# Patient Record
Sex: Female | Born: 1995 | Race: Asian
Health system: Southern US, Academic
[De-identification: ages and names within clinical notes are randomized; demographics above are authoritative.]

## PROBLEM LIST (undated history)

## (undated) ENCOUNTER — Encounter

## (undated) DIAGNOSIS — F319 Bipolar disorder, unspecified: Secondary | ICD-10-CM

## (undated) DIAGNOSIS — Z9114 Patient's other noncompliance with medication regimen: Secondary | ICD-10-CM

## (undated) DIAGNOSIS — F603 Borderline personality disorder: Secondary | ICD-10-CM

## (undated) DIAGNOSIS — F4310 Post-traumatic stress disorder, unspecified: Secondary | ICD-10-CM

## (undated) DIAGNOSIS — Z91148 Patient's other noncompliance with medication regimen for other reason: Secondary | ICD-10-CM

## (undated) DIAGNOSIS — F913 Oppositional defiant disorder: Secondary | ICD-10-CM

## (undated) DIAGNOSIS — F909 Attention-deficit hyperactivity disorder, unspecified type: Secondary | ICD-10-CM

## (undated) DIAGNOSIS — F32A Depression, unspecified: Secondary | ICD-10-CM

## (undated) DIAGNOSIS — R569 Unspecified convulsions: Secondary | ICD-10-CM

## (undated) DIAGNOSIS — F419 Anxiety disorder, unspecified: Secondary | ICD-10-CM

## (undated) DIAGNOSIS — F329 Major depressive disorder, single episode, unspecified: Secondary | ICD-10-CM

---

## 2017-02-04 DIAGNOSIS — F329 Major depressive disorder, single episode, unspecified: Secondary | ICD-10-CM

## 2017-02-04 DIAGNOSIS — F319 Bipolar disorder, unspecified: Secondary | ICD-10-CM

## 2017-02-04 DIAGNOSIS — R45851 Suicidal ideations: Secondary | ICD-10-CM

## 2017-02-04 DIAGNOSIS — Z9119 Patient's noncompliance with other medical treatment and regimen: Principal | ICD-10-CM

## 2017-02-04 DIAGNOSIS — G40909 Epilepsy, unspecified, not intractable, without status epilepticus: Secondary | ICD-10-CM

## 2017-02-04 DIAGNOSIS — B373 Candidiasis of vulva and vagina: Secondary | ICD-10-CM

## 2017-02-04 MED ORDER — LITHIUM CARBONATE 150 MG PO CAPS
Freq: Three times a day (TID) | ORAL | Status: SS
Start: 2017-02-04 — End: 2017-02-05

## 2017-02-04 MED ORDER — SERTRALINE HCL 25 MG PO TABS
Freq: Every day | ORAL | Status: SS
Start: 2017-02-04 — End: 2017-02-05

## 2017-02-04 MED ORDER — OLANZAPINE 10 MG PO TABS
Freq: Every evening | ORAL | Status: SS
Start: 2017-02-04 — End: 2017-02-09

## 2017-02-04 MED ORDER — ACETAMINOPHEN 325 MG PO TABS
650 mg | ORAL | Status: DC | PRN
Start: 2017-02-04 — End: 2017-02-05

## 2017-02-04 MED ORDER — DIVALPROEX SODIUM 125 MG PO TBEC
ORAL | Status: SS
Start: 2017-02-04 — End: 2017-02-09

## 2017-02-05 ENCOUNTER — Inpatient Hospital Stay: Admit: 2017-02-05 | Discharge: 2017-02-09 | Disposition: A | Discharge status: Home / Self Care | Admitting: Psychiatry

## 2017-02-05 DIAGNOSIS — F329 Major depressive disorder, single episode, unspecified: Secondary | ICD-10-CM

## 2017-02-05 DIAGNOSIS — Z9119 Patient's noncompliance with other medical treatment and regimen: Secondary | ICD-10-CM

## 2017-02-05 DIAGNOSIS — F319 Bipolar disorder, unspecified: Secondary | ICD-10-CM

## 2017-02-05 MED ORDER — LITHIUM CARBONATE 150 MG PO CAPS
300 mg | Freq: Two times a day (BID) | ORAL | Status: DC
Start: 2017-02-05 — End: 2017-02-10

## 2017-02-05 MED ORDER — ONDANSETRON 4 MG PO TBDP
4 mg | Freq: Four times a day (QID) | ORAL | Status: CP | PRN
Start: 2017-02-05 — End: ?

## 2017-02-05 MED ORDER — DIPHENHYDRAMINE HCL 50 MG PO CAPS
50 mg | Freq: Every evening | ORAL | Status: DC | PRN
Start: 2017-02-05 — End: 2017-02-10

## 2017-02-05 MED ORDER — OLANZAPINE 10 MG PO TABS
10 mg | Freq: Every evening | ORAL | Status: DC
Start: 2017-02-05 — End: 2017-02-10

## 2017-02-05 MED ORDER — SERTRALINE HCL 25 MG PO TABS
25 mg | Freq: Every day | ORAL | Status: DC
Start: 2017-02-05 — End: 2017-02-05

## 2017-02-05 MED ORDER — ALUM & MAG HYDROX-SIMETH 1200-1200-120 MG/30ML PO SUSP UD
30 mL | ORAL | Status: CP | PRN
Start: 2017-02-05 — End: ?

## 2017-02-05 MED ORDER — LORAZEPAM 2 MG/ML IJ SOLN
1 mg | INTRAMUSCULAR | Status: DC | PRN
Start: 2017-02-05 — End: 2017-02-10

## 2017-02-05 MED ORDER — BENZTROPINE MESYLATE 1 MG/ML IJ SOLN
1 mg | Freq: Four times a day (QID) | INTRAMUSCULAR | Status: DC | PRN
Start: 2017-02-05 — End: 2017-02-10

## 2017-02-05 MED ORDER — LITHIUM CARBONATE 150 MG PO CAPS
150 mg | Freq: Three times a day (TID) | ORAL | Status: DC
Start: 2017-02-05 — End: 2017-02-05

## 2017-02-05 MED ORDER — BENZTROPINE MESYLATE 1 MG PO TABS
1 mg | Freq: Four times a day (QID) | ORAL | Status: DC | PRN
Start: 2017-02-05 — End: 2017-02-10

## 2017-02-05 MED ORDER — DIVALPROEX SODIUM 125 MG PO CSDR
125 mg | Freq: Every day | ORAL | Status: DC
Start: 2017-02-05 — End: 2017-02-05

## 2017-02-05 MED ORDER — LITHIUM CARBONATE 300 MG PO CAPS
300 mg | Freq: Two times a day (BID) | ORAL | 0 refills | Status: SS
Start: 2017-02-05 — End: 2017-02-09

## 2017-02-05 MED ORDER — ACETAMINOPHEN 325 MG PO TABS
650 mg | Freq: Four times a day (QID) | ORAL | Status: CP | PRN
Start: 2017-02-05 — End: ?

## 2017-02-05 MED ORDER — HALOPERIDOL 2 MG/1 ML UNIT DOSE ORAL LIQD JX
2 mg | ORAL | Status: DC | PRN
Start: 2017-02-05 — End: 2017-02-10

## 2017-02-05 MED ORDER — ONDANSETRON 4 MG PO TBDP
4 mg | Freq: Four times a day (QID) | ORAL | Status: DC | PRN
Start: 2017-02-05 — End: 2017-02-05

## 2017-02-05 MED ORDER — GUAIFENESIN 300MG/15ML UD
15 mL | Freq: Four times a day (QID) | ORAL | Status: CP | PRN
Start: 2017-02-05 — End: ?

## 2017-02-05 MED ORDER — SERTRALINE HCL 100 MG PO TABS
100 mg | Freq: Every day | ORAL | 0 refills | Status: SS
Start: 2017-02-05 — End: 2017-02-09

## 2017-02-05 MED ORDER — HALOPERIDOL LACTATE 5 MG/ML IJ SOLN
2 mg | INTRAMUSCULAR | Status: DC | PRN
Start: 2017-02-05 — End: 2017-02-10

## 2017-02-05 MED ORDER — OLANZAPINE 10 MG PO TABS
10 mg | Freq: Every evening | ORAL | Status: DC
Start: 2017-02-05 — End: 2017-02-05

## 2017-02-05 MED ORDER — HALOPERIDOL 2 MG PO TABS
2 mg | ORAL | Status: DC | PRN
Start: 2017-02-05 — End: 2017-02-10

## 2017-02-05 MED ORDER — SERTRALINE HCL 50 MG PO TABS
50 mg | Freq: Every day | ORAL | Status: DC
Start: 2017-02-05 — End: 2017-02-10

## 2017-02-05 MED ORDER — LORAZEPAM 1 MG PO TABS
1 mg | ORAL | Status: DC | PRN
Start: 2017-02-05 — End: 2017-02-10

## 2017-02-05 MED ORDER — LORAZEPAM 1 MG/0.5 ML UNIT DOSE ORAL LIQD JX
1 mg | ORAL | Status: DC | PRN
Start: 2017-02-05 — End: 2017-02-10

## 2017-02-05 NOTE — Consults
Hemoglobin 10.5 (L) 12.0 - 16.0 g/dL    Hematocrit 96.034.6 (L) 37.0 - 47.0 %    MCV 80.3 (L) 82.0 - 101.0 fl    MCH 24.4 (L) 27.0 - 34.0 pg    MCHC 30.3 (L) 31.0 - 36.0 g/dL    RDW 45.416.8 (H) 09.812.0 - 16.1 %    Platelet Count 199 140 - 440 thou/cu mm    MPV 11.4 9.5 - 11.5 fl    nRBC % 0.0 0.0 - 1.0 %    Absolute NRBC Count 0.00    MANUAL DIFF JAX    Collection Time: 02/05/17  3:37 AM   Result Value Ref Range    Neutrophil % 39 %    Bands % 0 %    Lymphs % 54 %    Monocytes % 5 2 - 6 %    Eos % 2 1 - 4 %    Basos % 0 0 - 1 %    Metamyelocytes % 0 %    Myelocytes % 0 %    Promyelocytes % 0 %    Atypical Lymphocytes % 0 %    Neutrophil Abs 1.70 (L) 1.8 - 8.7 x10E3/uL    Lymphs Abs 2.40 thou/ cu mm    Monos Abs 0.23 x10E3/uL    Eos Abs 0.07 thou/ cu mm    Basos Abs 0.00     Immature Granulocytes Absolute 0.00 <=0.00 x10E3/uL   MORPHOLOGY JAX    Collection Time: 02/05/17  3:37 AM   Result Value Ref Range    Platelet Estimate Normal     Hypochromia Slight     Burr Cells Occasional     Poikilocytes Moderate     Diff Comment #DIGIM    Urinalysis W/Microscopy    Collection Time: 02/05/17  3:41 AM   Result Value Ref Range    Color -Ur Yellow Amber    Clarity, UA Clear Hazy    Specific Gravity, Urine 1.016 1.003 - 1.030    pH, Urine 6.0 4.5 - 8.0    Protein-UA Negative Negative mg/dL    Glucose -Ur Negative Negative mg/dL    Ketones UA Negative Negative mg/dL    Bilirubin -Ur Negative Negative    Blood -Ur Negative Negative    Nitrite -Ur Negative Negative    Urobilinogen -Ur 2.0 (A) Normal mg/dL    Leukocytes -Ur Negative Negative    RBC -Ur 1 0 - 5 /HPF    WBC -Ur 2 0 - 5 /HPF    Squam Epithel, UA <1 Not established /HPF    Bacteria -Ur None seen None seen /HPF    Mucus -Ur Rare 2+ /LPF    ASCORBIC ACID Negative 20 mg/dL   Urine Drugs of Abuse (DOA)    Collection Time: 02/05/17  3:41 AM   Result Value Ref Range    Amphetamine Screen, Urine Not Detected Not Detected    Barbiturate Screen, Urine Not Detected Not Detected

## 2017-02-05 NOTE — Consults
Benzodiazepine Screen, Urine Not Detected Not Detected    Cocaine Metabolites, Ur Not Detected Not Detected    Methadone Screen, Urine Not Detected Not Detected    Opiate Screen, Urine Not Detected Not Detected    OXYCODONE SCREEN URINE Not Detected Not Detected    Cannabinoid Scrn, Ur Not Detected Not Detected   HCG, Qualitative, Urine    Collection Time: 02/05/17  3:41 AM   Result Value Ref Range    HCG-Urine Negative Negative   POCT Urinalysis w/o Microscopy auto    Collection Time: 02/05/17  3:48 AM   Result Value Ref Range    Color -Ur Yellow     Clarity, UA Clear     Spec Grav 1.020 1.003 - 1.030    pH 7.0 (L) 7.4 - 7.4    Urobilinogen -Ur 1.0 <=2.0 E.U./dL    Nitrite -Ur Negative Negative    Protein-UA Negative Negative mg/dL    Glucose -Ur Negative Negative mg/dL    Blood, UA Negative Negative    WBC, UA Negative Negative    Bilirubin -Ur Negative Negative    Ketones -UR Negative Negative   POCT Urine Pregnancy    Collection Time: 02/05/17  3:49 AM   Result Value Ref Range    Preg Test, Urine (POC) Negative Negative             Assessment and Plan:    Leafy Halfmaris Erline HauRuth Happe is a 21 y.o. female with PMH of seizure disorder admitted for psychiatric illness    1. Seizure disorder- Continue Depakote 125mg  po daily  2. Rest per primary team

## 2017-02-05 NOTE — Consults
BP Temp Temp src Pulse Resp SpO2 Height Weight   02/05/17 0555 116/69 37 ?C (98.6 ?F) Oral 65 18 - 1.702 m (_0 ) 78.5 kg (173 lb)   02/05/17 0505 124/74 36.7 ?C (98.1 ?F) Oral 71 16 100 % - -   02/05/17 0209 105/61 36.9 ?C (98.4 ?F) Oral 90 18 99 % - -   02/04/17 2134 123/72 36.8 ?C (98.2 ?F) Oral 64 20 100 % 1.702 m (_1 ) 78.5 kg (173 lb)        Patient Vitals for the past 72 hrs:   Weight   02/04/17 2134 78.5 kg (173 lb)   02/05/17 0555 78.5 kg (173 lb)       General; Not in acute distress    Eyes; No pallor, No Icterus    Skin; No Rash, No ulcers    Neck; No thyromegaly, No Lymphadenopathy    Lungs; No dullness, No wheeze/crackles    Cardiac; No cardiomegaly, no murmurs    Abdomen; Non tender, Normal bowel sounds    Extremities; No edema, No leg ulcers    CNS; Normal Cranial nerves, No weakness      LABS and other tests;   Recent Results (from the past 48 hour(s))   Comprehensive Metabolic Panel    Collection Time: 02/05/17  3:37 AM   Result Value Ref Range    Sodium 141 135 - 145 mmol/L    Potassium 3.8 3.3 - 4.6 mmol/L    Chloride 101 101 - 110 mmol/L    CO2 23 21 - 29 mmol/L    Urea Nitrogen 9 6 - 22 mg/dL    Creatinine 0.67 0.51 - 0.95 mg/dL    BUN/Creatinine Ratio 13.4 6.0 - 22.0 (calc)    Glucose 84 71 - 99 mg/dL    Calcium 8.6 8.6 - 10.0 mg/dL    Total Protein 5.8 (L) 6.5 - 8.3 g/dL    Albumin 3.5 (L) 3.8 - 4.9 g/dL    Calc Total Globuin 2.3 gm/dL    ALBUMIN/GLOBULIN RATIO 1.5 (calc)    Total Bilirubin 0.2 0.2 - 1.0 mg/dL    Alkaline Phosphatase 56 35 - 104 IU/L    AST 13 (L) 14 - 33 IU/L    ALT 6 (L) 10 - 42 IU/L    Osmolality Calc 279.1     Anion Gap 17 (H) 4 - 16 mmol/L    EGFR >59 mL/min/1.73M2   TSH    Collection Time: 02/05/17  3:37 AM   Result Value Ref Range    TSH 1.890 0.270 - 4.200 mIU/L   CBC with Differential panel result    Collection Time: 02/05/17  3:37 AM   Result Value Ref Range    WBC 4.41 (L) 4.5 - 11 x10E3/uL    RBC 4.31 4.20 - 5.40 x10E6/uL

## 2017-02-05 NOTE — ED Provider Notes
Yes, filed at 02/04/17 2206  by Wynonia HazardMichel, Keegan Anne, MD

## 2017-02-05 NOTE — Consults
Department of Medicine  IM Consult H&P for Psychiatry     Admission Date and Time: 02/04/2017  9:57 PM   Primary Care Physician: No primary care provider on file.   CC and HPI:   Cheyenne Rhodes is a 21 y.o. female who is being admitted for the evaluation and management of psychiatric illness.  Medicine consultation has been requested for the management of any medical problems.    Patient has no complaints of SOB, dysuria, fever or productive cough.  She stated she took Depakote in the past for seizure disorder. Her last seizure was last week              Past Medical History:   Diagnosis Date   ? Bipolar 1 disorder    ? Depression    ? Personal history of noncompliance with medical treatment, presenting hazards to health      History reviewed. No pertinent surgical history.  No family history on file.  Social History     Social History   ? Marital status: Single     Spouse name: N/A   ? Number of children: N/A   ? Years of education: N/A     Social History Main Topics   ? Smoking status: Never Smoker   ? Smokeless tobacco: None   ? Alcohol use No   ? Drug use: No   ? Sexual activity: Not Asked     Other Topics Concern   ? None     Social History Narrative       Home Medications:  Prescriptions Prior to Admission   Medication Sig   ? divalproex (DEPAKOTE) 125 MG PO Tablet Delayed Release by mouth.   ? lithium 150 MG PO Capsule Take by mouth 3 times daily (with meals).   ? OLANZapine (ZYPREXA) 10 MG PO Tablet Take by mouth nightly at bedtime.   ? sertraline (ZOLOFT) 25 MG PO Tablet Take by mouth daily.     Allergies   Allergen Reactions   ? Chocolate Angioedema   ? Risperidone Other (See Comments)     "I dont really know, I just know I'm allergic"        Review of Systems:  Cardiovascular - No Chest pain, no palpitations  GI; No Melena  Gen; No unintentional weight loss  All other review of systems are negative.    Physical Exam:     Patient Vitals for the past 24 hrs:

## 2017-02-05 NOTE — ED Provider Notes
Procedures    Labs:  - - No data to display      Imaging (Read by ED Provider):  {Imaging findings:[phone]}      EKG (Read by ED Provider):  {EKG findings:[id number]}        ED Course & Re-Evaluation     ED Course     Pt evlauted for SI. Gerrit HeckKeegan Michel, MD 10:07 PM 02/04/2017        MDM   Decide to obtain history from someone other than the patient: {SH ED Lamonte SakaiJX MDM - OBTAIN [id number]:91478}HISTORY:28378}    Decide to obtain previous medical records: [facility]{SH ED Lamonte SakaiJX MDM - PREVIOUS MED REC - NO GNF:62130}YES:28380}    Clinical Lab Test(s): {SH ED Lamonte SakaiJX MDM ORDERED AND REVIEWED:28124}    Diagnostic Tests (Radiology, EKG): {SH ED Lamonte SakaiJX MDM ORDERED AND REVIEWED:28124}    Independent Visualization (ED US, Wet Prep, Other): {SH ED Lamonte SakaiJX MDM NO YES QMVHQION:62952}WILDCARD:26444}    Discussed patient with NON-ED Provider: {SH ED Lamonte SakaiJX MDM - ANOTHER PROVIDER:28381}    ED Disposition   ED Disposition: No ED Disposition Set    ED Clinical Impression   ED Clinical Impression:   No Clinical Impression Set    ED Patient Status   Patient Status:   {[facility] PAT[facility]US:[phone]}    ED Medical Evaluation Initiated   Medical Evaluation Initiated:   Yes, filed at 02/04/17 2206  by Wynonia HazardMichel, Keegan Anne, MD

## 2017-02-05 NOTE — ED Provider Notes
Neck: Normal range of motion. Neck supple.   Cardiovascular: Normal rate, regular rhythm, normal heart sounds and intact distal pulses.  Exam reveals no gallop.    No murmur heard.  Pulmonary/Chest: Effort normal and breath sounds normal. No respiratory distress. She has no wheezes. She has no rales. She exhibits no tenderness.   Abdominal: Soft. Bowel sounds are normal. She exhibits no distension and no mass. There is no tenderness. There is no rebound and no guarding.   Musculoskeletal: Normal range of motion. She exhibits no edema, tenderness or deformity.   Neurological: She is alert and oriented to person, place, and time. She has normal reflexes. She exhibits normal muscle tone. Coordination normal.   Skin: Skin is warm and dry. No rash noted. She is not diaphoretic.   Psychiatric:   Blunted affect, alert and oriented. Goal directed.    Nursing note and vitals reviewed.      Differential DDx: SI, HI, hypothyroidism, other.     Is this an Emergent Medical Condition? Yes - Severe Pain/Acute Onset of Symptons  409.901 FS  641.19 FS  627.732 (16) FS    ED Workup   Procedures    Labs:  - - No data to display    Imaging (Read by ED Provider):  not applicable      EKG (Read by ED Provider):  not applicable    ED Course & Re-Evaluation     ED Course     Pt evlauted for SI. Gerrit HeckKeegan Michel, MD 10:07 PM 02/04/2017        MDM   Decide to obtain history from someone other than the patient: No    Decide to obtain previous medical records: No    Clinical Lab Test(s): N/A    Diagnostic Tests (Radiology, EKG): N/A    Independent Visualization (ED US, Wet Prep, Other): No    Discussed patient with NON-ED Provider: None    ED Disposition   ED Disposition: No ED Disposition Set    ED Clinical Impression   ED Clinical Impression:   Suicidal ideation  Bipolar 1 disorder  Depression, acute    ED Patient Status   Patient Status:   Good    ED Medical Evaluation Initiated   Medical Evaluation Initiated:

## 2017-02-05 NOTE — Plan of Care
Problem: Risk for Suicide  Goal: Will not make suicide attempt while in hospital  Short Term        Intervention: Assess for suicide risk q shift and PRN  Pt slept til lunch, but has been up since, interacting with peers and staff.  Was c/o "cyst" on right side of lower head and neck.  Consult put in for IM in the AM.  Denies s/h, a/v, o to self, somewhat vague on others.  Eating well (excrpt breakfast), cooperative with unit routine and no behavior problem.  Will continue to support and monitor.

## 2017-02-05 NOTE — ED Triage Notes
Arrived from cousin house via Presenter, broadcastingjso officer under a Teacher, early years/prebaker act. A/ox3 c/o feeling depressed and wanting to leave this earth aka commit suicide. To ecc.

## 2017-02-05 NOTE — ED Notes
[facility] at the bedside to take pt to [facility] via stretcher w/ chart & personal belongings.

## 2017-02-05 NOTE — Psych Nurses Notes
Admission: 02/05/17 0555    BA: "Patient off her medcations and states that she wants to harm self"     Patient arrives to the unit in green scrubs, alert and oriented x4. Patient maintains a blunted affect, appropriate eye contact and responds to prompts appropriately. Patient reports that, "I have been living with my family and they do stuff I dont want to get into." Patient states that she does think of "suicide, but I don't have a plan. I was just [employer] for [employer]." Patient reports that she has, "been depressed for about 6 months and I havent had my medications for about 1 month because I dont have a primary care doctor and I ran out of my meds." Patient states that she was previously on Lithium, Depakote, Zoloft and "a bunch of other meds, like 2 pages worth." Patient states that she has a history of seizures, which was not identified in her chart, and, "I go into these spells where I just stare into space and then I shake and stuff." Patient reports no other medical conditions. Patient denies any hallucinations or delusions. Patient states, "I have pills in my bag that I have but im not sure what all they are." Patient states that she does not use illicit drugs or alcohol. Patient reports mild anxiety and states, "I get anxious sometimes and scratch my skin." Patient has scratch marks to left arm that are superficial.     Gown out and body observation completed by Richelle and Sylivia. No abnormal findings on skin and no contraband found on patient.

## 2017-02-05 NOTE — Progress Notes
Pt seen and chart reviewed, 20y/o AA/F, Arrived from cousin's house via Presenter, broadcastingjso officer under a Teacher, early years/prebaker act. Pt reports she had a fight with family and as such is feeling s/i. [facility] She is noncompliant with rx and "doesn't remember" what she was prescribed while there. Denies any f/u. No etoh or illicits. Unemployed. 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 No homeless.     Currently, a,a and Ox4. Speech is clear and logical. thoughts organized  and relevant. Eye contact is good. Grooming is good. No indication of a/v/h, delusions or sx of mania. No h/i. S/i as above. Calm and cooperative.     Recs - ba to be maintained and pt has been referred to [facility].     Axis I r/o depressive d/o nos vs malingering due to homelessness.   Axis II Deferred  Axis III stable     Language and Speech  Spoken Language: English    Vitals  Weight: 78.5 kg (173 lb)  Temp: 36.8 ?C (98.2 ?F)  Temp src: Oral  Pulse: 64  Resp: 20  BP: 123/72

## 2017-02-05 NOTE — ED Provider Notes
History     Chief Complaint   Patient presents with   ? Suicidal     Patient is a 21 y.o. female presenting with mental health issue. The history is provided by medical records and the patient. No language interpreter was used.   Mental Health Problem       Allergies   Allergen Reactions   ? Chocolate Angioedema       Patient's Medications   New Prescriptions    No medications on file   Previous Medications    DIVALPROEX (DEPAKOTE) 125 MG PO TABLET DELAYED RELEASE    by mouth.    LITHIUM 150 MG PO CAPSULE    Take by mouth 3 times daily (with meals).    OLANZAPINE (ZYPREXA) 10 MG PO TABLET    Take by mouth nightly at bedtime.    SERTRALINE (ZOLOFT) 25 MG PO TABLET    Take by mouth daily.   Modified Medications    No medications on file   Discontinued Medications    No medications on file       Past Medical History:   Diagnosis Date   ? Bipolar 1 disorder    ? Depression    ? Personal history of noncompliance with medical treatment, presenting hazards to health      History reviewed. No pertinent surgical history.    No family history on file.    Social History     Social History   ? Marital status: N/A     Spouse name: N/A   ? Number of children: N/A   ? Years of education: N/A     Social History Main Topics   ? Smoking status: Never Smoker   ? Smokeless tobacco: None   ? Alcohol use No   ? Drug use: No   ? Sexual activity: Not Asked     Other Topics Concern   ? None     Social History Narrative   ? None       Review of Systems    Physical Exam       ED Triage Vitals   BP 02/04/17 2134 123/72   Pulse 02/04/17 2134 64   Resp 02/04/17 2134 20   Temp 02/04/17 2134 36.8 ?C (98.2 ?F)   Temp src 02/04/17 2134 Oral   Height 02/04/17 2134 1.702 m   Weight 02/04/17 2134 78.5 kg   SpO2 02/04/17 2134 100 %   BMI (Calculated) 02/04/17 2134 27.15     Physical Exam    Differential DDx: ***    Is this an Emergent Medical Condition? Yes - Severe Pain/Acute Onset of Symptons  409.901 FS  641.19 FS  627.732 (16) FS    ED Workup

## 2017-02-05 NOTE — ED Provider Notes
Results may be affected by exogenous biotin consumption. Please consult the laboratory if an interference is suspected.   POCT URINE PREGNANCY - Normal    Preg Test, Urine (POC) Negative  Negative   MORPHOLOGY JAX    Platelet Estimate Normal      Hypochromia Slight      Burr Cells Occasional      Poikilocytes Moderate      Diff Comment #DIGIM     URINALYSIS W/MICROSCOPY   HCG, QUALITATIVE, URINE   POCT URINE PREGNANCY   POCT URINALYSIS AUTO W/O MICROSCOPY   CBC AND DIFFERENTIAL     Imaging (Read by ED Provider):  not applicable      EKG (Read by ED Provider):  not applicable    ED Course & Re-Evaluation     ED Course     Pt evlauted for SI. Gerrit HeckKeegan Michel, MD 10:07 PM 02/04/2017    Pregnancy is negative, no other complaints, pt is medically cleared. Will place psych continuation orders and home medications. Gerrit HeckKeegan Michel, MD 5:05 AM 02/05/2017    Reccomend maintainins BA and pt is awaiting transfer to psych facility.     MDM   Decide to obtain history from someone other than the patient: No    Decide to obtain previous medical records: No    Clinical Lab Test(s): N/A    Diagnostic Tests (Radiology, EKG): N/A    Independent Visualization (ED US, Wet Prep, Other): No    Discussed patient with NON-ED Provider: None    ED Disposition   ED Disposition: Transfer to Another Facility    ED Clinical Impression   ED Clinical Impression:   Suicidal ideation  Bipolar 1 disorder  Depression, acute    ED Patient Status   Patient Status:   Good    ED Medical Evaluation Initiated   Medical Evaluation Initiated:   Yes, filed at 02/04/17 2206  by Wynonia HazardMichel, Keegan Anne, MD             Wynonia HazardMichel, Keegan Anne, MD  Resident  02/05/17 [phone]

## 2017-02-05 NOTE — ED Provider Notes
No family history on file.    Social History     Social History   ? Marital status: N/A     Spouse name: N/A   ? Number of children: N/A   ? Years of education: N/A     Social History Main Topics   ? Smoking status: Never Smoker   ? Smokeless tobacco: None   ? Alcohol use No   ? Drug use: No   ? Sexual activity: Not Asked     Other Topics Concern   ? None     Social History Narrative   ? None       Review of Systems   Constitutional: Negative for fever and chills.   HENT: Negative for trouble swallowing and neck stiffness.    Eyes: Negative for visual disturbance.   Respiratory: Negative for chest tightness, shortness of breath and wheezing.    Cardiovascular: Negative for chest pain.   Gastrointestinal: Negative for nausea, vomiting, abdominal pain, diarrhea and abdominal distention.   Genitourinary: Negative for dysuria.   Musculoskeletal: Negative for gait problem.   Skin: Negative for wound.   Neurological: Negative for weakness.   Hematological: Negative for lymphadenopathy.     Psychiatric/Behavioral: Positive for suicidal ideas. Negative for behavioral problems.   Allergic/Immunologic: Negative for immunocompromised state.   All other systems reviewed and are negative.      Physical Exam       ED Triage Vitals   BP 02/04/17 2134 123/72   Pulse 02/04/17 2134 64   Resp 02/04/17 2134 20   Temp 02/04/17 2134 36.8 ?C (98.2 ?F)   Temp src 02/04/17 2134 Oral   Height 02/04/17 2134 1.702 m   Weight 02/04/17 2134 78.5 kg   SpO2 02/04/17 2134 100 %   BMI (Calculated) 02/04/17 2134 27.15     Physical Exam   Constitutional: She is oriented to person, place, and time. She appears well-developed and well-nourished. No distress.   HENT:   Head: Normocephalic and atraumatic.   Right Ear: External ear normal.   Left Ear: External ear normal.   Mouth/Throat: No oropharyngeal exudate.   Eyes: Conjunctivae and EOM are normal. Pupils are equal, round, and reactive to light. No scleral icterus.

## 2017-02-05 NOTE — Psych Nurses Notes
Pt signed voluntary.

## 2017-02-05 NOTE — ED Notes
Pt assigned to [facility] room 404. Report to Xcel Energyichelle, Charity fundraiserN.

## 2017-02-05 NOTE — ED Provider Notes
Results may be affected by exogenous biotin consumption. Please consult the laboratory if an interference is suspected.   POCT URINE PREGNANCY - Normal    Preg Test, Urine (POC) Negative  Negative   MORPHOLOGY JAX    Platelet Estimate Normal      Hypochromia Slight      Burr Cells Occasional      Poikilocytes Moderate      Diff Comment #DIGIM     URINALYSIS W/MICROSCOPY   HCG, QUALITATIVE, URINE   POCT URINE PREGNANCY   POCT URINALYSIS AUTO W/O MICROSCOPY   CBC AND DIFFERENTIAL     Imaging (Read by ED Provider):  not applicable      EKG (Read by ED Provider):  not applicable    ED Course & Re-Evaluation     ED Course     Pt evlauted for SI. Gerrit HeckKeegan Michel, MD 10:07 PM 02/04/2017    Pregnancy is negative, no other complaints, pt is medically cleared. Will place psych continuation orders and home medications. Gerrit HeckKeegan Michel, MD 5:05 AM 02/05/2017    Reccomend maintainins BA and pt is awaiting transfer to psych facility.     MDM   Decide to obtain history from someone other than the patient: No    Decide to obtain previous medical records: No    Clinical Lab Test(s): N/A    Diagnostic Tests (Radiology, EKG): N/A    Independent Visualization (ED US, Wet Prep, Other): No    Discussed patient with NON-ED Provider: None    ED Disposition   ED Disposition: Transfer to Another Facility    ED Clinical Impression   ED Clinical Impression:   Suicidal ideation  Bipolar 1 disorder  Depression, acute    ED Patient Status   Patient Status:   Good    ED Medical Evaluation Initiated   Medical Evaluation Initiated:   Yes, filed at 02/04/17 2206  by Wynonia HazardMichel, Keegan Anne, MD

## 2017-02-05 NOTE — ED Provider Notes
History     Chief Complaint   Patient presents with   ? Suicidal     HPI Comments: Pt is a 21 yo female with PMHx of depression, bipolar 1, was baker acted in january of 2018 and at that time diagnosed and started on lithium and zyprexia. She staets she took those for about a month, then stopped taking her medications one month prior. She then was kicked out of her house by her mom and has been living with friends. She was with her cousin and had been feeling very depressed for the last 2 wqeeks but that she has been gradually worsening. Today she told her cousin that she just didnt want to live any more and her cousin caleld police who placed her under a baker act. She denies hallucination, she denies ingestion of drug use.     Patient is a 21 y.o. female presenting with mental health issue. The history is provided by medical records and the patient. No language interpreter was used.   Mental Health Problem   This is a new problem. The current episode started today. The problem occurs constantly. The problem has been unchanged. Pertinent negatives include no abdominal pain, chest pain, chills, fever, nausea, vomiting or weakness.     Allergies   Allergen Reactions   ? Chocolate Angioedema       Patient's Medications   New Prescriptions    No medications on file   Previous Medications    DIVALPROEX (DEPAKOTE) 125 MG PO TABLET DELAYED RELEASE    by mouth.    LITHIUM 150 MG PO CAPSULE    Take by mouth 3 times daily (with meals).    OLANZAPINE (ZYPREXA) 10 MG PO TABLET    Take by mouth nightly at bedtime.    SERTRALINE (ZOLOFT) 25 MG PO TABLET    Take by mouth daily.   Modified Medications    No medications on file   Discontinued Medications    No medications on file       Past Medical History:   Diagnosis Date   ? Bipolar 1 disorder    ? Depression    ? Personal history of noncompliance with medical treatment, presenting hazards to health      History reviewed. No pertinent surgical history.

## 2017-02-05 NOTE — ED Provider Notes
eGFR estimates are unable to accurately differentiate levels of GFR above 60 ml/min/1.73M2.   CBC AUTODIFF - Abnormal     WBC 4.41 (*) 4.5 - 11 x10E3/uL    RBC 4.31  4.20 - 5.40 x10E6/uL    Hemoglobin 10.5 (*) 12.0 - 16.0 g/dL    Hematocrit 34.6 (*) 37.0 - 47.0 %    MCV 80.3 (*) 82.0 - 101.0 fl    MCH 24.4 (*) 27.0 - 34.0 pg    MCHC 30.3 (*) 31.0 - 36.0 g/dL    RDW 16.8 (*) 12.0 - 16.1 %    Platelet Count 199  140 - 440 thou/cu mm    MPV 11.4  9.5 - 11.5 fl    nRBC % 0.0  0.0 - 1.0 %    Absolute NRBC Count 0.00     MANUAL DIFF JAX - Abnormal     Neutrophil % 39  %    Bands % 0  %    Lymphs % 54  %    Monocytes % 5  2 - 6 %    Eos % 2  1 - 4 %    Basos % 0  0 - 1 %    Metamyelocytes % 0  %    Myelocytes % 0  %    Promyelocytes % 0  %    Atypical Lymphocytes % 0  %    Neutrophil Abs 1.70 (*) 1.8 - 8.7 x10E3/uL    Lymphs Abs 2.40  thou/ cu mm    Monos Abs 0.23  x10E3/uL    Eos Abs 0.07  thou/ cu mm    Basos Abs 0.00      Immature Granulocytes Absolute 0.00  <=0.00 x10E3/uL   POCT URINALYSIS AUTO W/O MICROSCOPY - Abnormal     Color -Ur Yellow      Clarity, UA Clear      Spec Grav 1.020  1.003 - 1.030    pH 7.0 (*) 7.4 - 7.4    Urobilinogen -Ur 1.0  <=2.0 E.U./dL    Nitrite -Ur Negative  Negative    Protein-UA Negative  Negative mg/dL    Glucose -Ur Negative  Negative mg/dL    Blood, UA Negative  Negative    WBC, UA Negative  Negative    Bilirubin -Ur Negative  Negative    Ketones -UR Negative  Negative   DOA SCREEN (JAX) - Normal    Amphetamine Screen, Urine Not Detected  Not Detected    Barbiturate Screen, Urine Not Detected  Not Detected    Benzodiazepine Screen, Urine Not Detected  Not Detected    Cocaine Metabolites, Ur Not Detected  Not Detected    Methadone Screen, Urine Not Detected  Not Detected    Opiate Screen, Urine Not Detected  Not Detected    OXYCODONE SCREEN URINE Not Detected  Not Detected    Cannabinoid Scrn, Ur Not Detected  Not Detected   TSH - Normal    TSH 1.890  0.270 - 4.200 mIU/L    Comment:

## 2017-02-05 NOTE — ED Notes
Pt assigned to room 404. Report to Xcel Energyichelle, Charity fundraiserN.

## 2017-02-05 NOTE — ED Provider Notes
Neck: Normal range of motion. Neck supple.   Cardiovascular: Normal rate, regular rhythm, normal heart sounds and intact distal pulses.  Exam reveals no gallop.    No murmur heard.  Pulmonary/Chest: Effort normal and breath sounds normal. No respiratory distress. She has no wheezes. She has no rales. She exhibits no tenderness.   Abdominal: Soft. Bowel sounds are normal. She exhibits no distension and no mass. There is no tenderness. There is no rebound and no guarding.   Musculoskeletal: Normal range of motion. She exhibits no edema, tenderness or deformity.   Neurological: She is alert and oriented to person, place, and time. She has normal reflexes. She exhibits normal muscle tone. Coordination normal.   Skin: Skin is warm and dry. No rash noted. She is not diaphoretic.   Psychiatric:   Blunted affect, alert and oriented. Goal directed.    Nursing note and vitals reviewed.      Differential DDx: SI, HI, hypothyroidism, other.     Is this an Emergent Medical Condition? Yes - Severe Pain/Acute Onset of Symptons  409.901 FS  641.19 FS  627.732 (16) FS    ED Workup   Procedures    Labs:  -   COMPREHENSIVE METABOLIC PANEL - Abnormal        Result Value Ref Range    Sodium 141  135 - 145 mmol/L    Potassium 3.8  3.3 - 4.6 mmol/L    Chloride 101  101 - 110 mmol/L    CO2 23  21 - 29 mmol/L    Urea Nitrogen 9  6 - 22 mg/dL    Creatinine 0.67  0.51 - 0.95 mg/dL    BUN/Creatinine Ratio 13.4  6.0 - 22.0 (calc)    Glucose 84  71 - 99 mg/dL    Calcium 8.6  8.6 - 10.0 mg/dL    Total Protein 5.8 (*) 6.5 - 8.3 g/dL    Albumin 3.5 (*) 3.8 - 4.9 g/dL    Calc Total Globuin 2.3  gm/dL    ALBUMIN/GLOBULIN RATIO 1.5  (calc)    Total Bilirubin 0.2  0.2 - 1.0 mg/dL    Alkaline Phosphatase 56  35 - 104 IU/L    AST 13 (*) 14 - 33 IU/L    ALT 6 (*) 10 - 42 IU/L    Osmolality Calc 279.1      Anion Gap 17 (*) 4 - 16 mmol/L    EGFR >59  mL/min/1.73M2    Comment:   Reference range: =>90 ml/min/1.73M2

## 2017-02-05 NOTE — Psych Nurses Notes
Earlier, this afternoon, pt requested po prn ativan with relief.

## 2017-02-05 NOTE — ED Provider Notes
No family history on file.    Social History     Social History   ? Marital status: Single     Spouse name: N/A   ? Number of children: N/A   ? Years of education: N/A     Social History Main Topics   ? Smoking status: Never Smoker   ? Smokeless tobacco: None   ? Alcohol use No   ? Drug use: No   ? Sexual activity: Not Asked     Other Topics Concern   ? None     Social History Narrative       Review of Systems   Constitutional: Negative for fever and chills.   HENT: Negative for trouble swallowing and neck stiffness.    Eyes: Negative for visual disturbance.   Respiratory: Negative for chest tightness, shortness of breath and wheezing.    Cardiovascular: Negative for chest pain.   Gastrointestinal: Negative for nausea, vomiting, abdominal pain, diarrhea and abdominal distention.   Genitourinary: Negative for dysuria.   Musculoskeletal: Negative for gait problem.   Skin: Negative for wound.   Neurological: Negative for weakness.   Hematological: Negative for lymphadenopathy.     Psychiatric/Behavioral: Positive for suicidal ideas. Negative for behavioral problems.   Allergic/Immunologic: Negative for immunocompromised state.   All other systems reviewed and are negative.      Physical Exam       ED Triage Vitals   BP 02/04/17 2134 123/72   Pulse 02/04/17 2134 64   Resp 02/04/17 2134 20   Temp 02/04/17 2134 36.8 ?C (98.2 ?F)   Temp src 02/04/17 2134 Oral   Height 02/04/17 2134 1.702 m   Weight 02/04/17 2134 78.5 kg   SpO2 02/04/17 2134 100 %   BMI (Calculated) 02/04/17 2134 27.15     Physical Exam   Constitutional: She is oriented to person, place, and time. She appears well-developed and well-nourished. No distress.   HENT:   Head: Normocephalic and atraumatic.   Right Ear: External ear normal.   Left Ear: External ear normal.   Mouth/Throat: No oropharyngeal exudate.   Eyes: Conjunctivae and EOM are normal. Pupils are equal, round, and reactive to light. No scleral icterus.

## 2017-02-05 NOTE — Psych Nurses Notes
Earlier this afternoon, pt requested po prn ativan with fair results.

## 2017-02-06 MED ORDER — CELECOXIB 100 MG PO CAPS
100 mg | Freq: Two times a day (BID) | ORAL | Status: CP
Start: 2017-02-06 — End: ?

## 2017-02-06 NOTE — Plan of Care
Problem: Impaired Social Skills  Goal: Demonstrates improved social interactions  Outcome: Ongoing  Patient attends RT group briefly.  Patient is social with peers but not certain if she is able to follow the tasks @ hand.

## 2017-02-06 NOTE — Consults
Department of Medicine  IM Consult H&P for Psychiatry     Admission Date and Time: 02/04/2017  9:57 PM   Primary Care Physician: No primary care provider on file.   CC and HPI:   Cheyenne Rhodes is a 21 y.o. female who is being admitted for the evaluation and management of psychiatric illness.  Medicine consultation has been requested for the management of any medical problems.  Patient has no complaints of SOB, dysuria, fever or productive cough.              Past Medical History:   Diagnosis Date   ? Bipolar 1 disorder    ? Bipolar I disorder with depression 02/05/2017   ? Depression    ? Non-adherence to medical treatment 02/05/2017   ? Personal history of noncompliance with medical treatment, presenting hazards to health      History reviewed. No pertinent surgical history.  No family history on file.  Social History     Social History   ? Marital status: Single     Spouse name: N/A   ? Number of children: N/A   ? Years of education: N/A     Social History Main Topics   ? Smoking status: Never Smoker   ? Smokeless tobacco: None   ? Alcohol use No   ? Drug use: No   ? Sexual activity: Not Asked     Other Topics Concern   ? None     Social History Narrative       Home Medications:  Prescriptions Prior to Admission   Medication Sig   ? lithium 300 MG PO Capsule Take 300 mg by mouth 2 times daily. Last dispensed 01/07/2017 Tanja PortBoaz, Sarah   ? sertraline (ZOLOFT) 100 MG PO Tablet Take 100 mg by mouth daily. Last dispensed 01/07/2017 by Tanja PortSarah Boaz   ? divalproex (DEPAKOTE) 125 MG PO Tablet Delayed Release by mouth.   ? OLANZapine (ZYPREXA) 10 MG PO Tablet Take by mouth nightly at bedtime.     Allergies   Allergen Reactions   ? Chocolate Angioedema   ? Risperidone Other (See Comments)     "I dont really know, I just know I'm allergic"        Review of Systems:  Cardiovascular - No Chest pain, no palpitations  GI; No Melena  Gen; No unintentional weight loss  All other review of systems are negative.    Physical Exam:

## 2017-02-06 NOTE — Psych Progress Note
BA52- "[birth year], SSN [id number]. Meds. Lithium, Zoloft, Zyprexa. Last B/A- January (1 only). Pt said she was depressed, having a bad day and wanted to harm herself." Pt reports she was staying with her cousin. She is unsure if she is returning there. She denies having a psychiatrist. Pt will need transportation at discharge. No barriers to discharge at this time.   Discharge Plan: Home  Discharge Pending: psych stabilization  Expected Discharge Date:  TBD  Transportation/Contact Person at Discharge: taxi  Darden PalmerJulie K Copper M.S., C.R.C.   02/06/2017 9:40 AM

## 2017-02-06 NOTE — Consults
Patient Vitals for the past 24 hrs:   BP Temp Temp src Pulse Resp   02/06/17 0718 111/75 36.9 ?C (98.5 ?F) Oral 67 17   02/05/17 1928 122/74 36.8 ?C (98.2 ?F) Oral 64 16        Patient Vitals for the past 72 hrs:   Weight   02/04/17 2134 78.5 kg (173 lb)   02/05/17 0555 78.5 kg (173 lb)       General; Not in acute distress    Eyes; No pallor, No Icterus    Skin; No Rash, No ulcers    Neck; No thyromegaly, No Lymphadenopathy    Lungs; No dullness, No wheeze/crackles    Cardiac; No cardiomegaly, no murmurs    Abdomen; Non tender, Normal bowel sounds    Extremities; No edema, No leg ulcers    CNS; Normal Cranial nerves, No weakness      LABS and other tests;   Recent Results (from the past 48 hour(s))   Comprehensive Metabolic Panel    Collection Time: 02/05/17  3:37 AM   Result Value Ref Range    Sodium 141 135 - 145 mmol/L    Potassium 3.8 3.3 - 4.6 mmol/L    Chloride 101 101 - 110 mmol/L    CO2 23 21 - 29 mmol/L    Urea Nitrogen 9 6 - 22 mg/dL    Creatinine 0.67 0.51 - 0.95 mg/dL    BUN/Creatinine Ratio 13.4 6.0 - 22.0 (calc)    Glucose 84 71 - 99 mg/dL    Calcium 8.6 8.6 - 10.0 mg/dL    Total Protein 5.8 (L) 6.5 - 8.3 g/dL    Albumin 3.5 (L) 3.8 - 4.9 g/dL    Calc Total Globuin 2.3 gm/dL    ALBUMIN/GLOBULIN RATIO 1.5 (calc)    Total Bilirubin 0.2 0.2 - 1.0 mg/dL    Alkaline Phosphatase 56 35 - 104 IU/L    AST 13 (L) 14 - 33 IU/L    ALT 6 (L) 10 - 42 IU/L    Osmolality Calc 279.1     Anion Gap 17 (H) 4 - 16 mmol/L    EGFR >59 mL/min/1.73M2   TSH    Collection Time: 02/05/17  3:37 AM   Result Value Ref Range    TSH 1.890 0.270 - 4.200 mIU/L   CBC with Differential panel result    Collection Time: 02/05/17  3:37 AM   Result Value Ref Range    WBC 4.41 (L) 4.5 - 11 x10E3/uL    RBC 4.31 4.20 - 5.40 x10E6/uL    Hemoglobin 10.5 (L) 12.0 - 16.0 g/dL    Hematocrit 34.6 (L) 37.0 - 47.0 %    MCV 80.3 (L) 82.0 - 101.0 fl    MCH 24.4 (L) 27.0 - 34.0 pg    MCHC 30.3 (L) 31.0 - 36.0 g/dL    RDW 16.8 (H) 12.0 - 16.1 %

## 2017-02-06 NOTE — Psych Nurses Notes
Report given to Dianna.

## 2017-02-06 NOTE — Psych Progress Note
Department of Case Management  Discharge Planning     Patient ID:   NAME:  Cheyenne Rhodes  MRN: [id number]    AGE: 21 y.o.   DOB: Oct 25, 1996  Date of Admission: 02/04/2017      Address: [address]  [place] [address]     Emergency Contact: No ROI on file at this time  Payor: Payor: [employer] / Plan: Altha / Product Type: HMO/POS /     Medicare Important Message Provided:  n/a.   Patient admitted with   Patient Active Problem List   Diagnosis   ? Bipolar I disorder with depression   ? Bipolar I disorder with mixed features   ? Non-adherence to medical treatment       Patient has Capacity for Decision-Making: yes  Prior Living Arrangements / Resides with: family  Patient Capacity for Self-Care / Level of Independence: independent  Caregiver / Support System: family  Patient provided contact person:    Name:    Relationship:    Phone #:    Advance Directive: not at this time  Health Care Proxy: no  Therapy Recommendations: [place], Durable Medical Equipment: none  Community Resources: refer to [facility] for med mgmt  Outpatient Case Manager: none  Primary Care Physician: No primary care provider on file.   Pharmacy: No Pharmacies Listed  Social History:   Social History     Social History   ? Marital status: Single     Spouse name: N/A   ? Number of children: N/A   ? Years of education: N/A     Occupational History   ? Not on file.     Social History Main Topics   ? Smoking status: Never Smoker   ? Smokeless tobacco: Not on file   ? Alcohol use No   ? Drug use: No   ? Sexual activity: Not on file     Other Topics Concern   ? Not on file     Social History Narrative     Assessment and Discharge Plan:  Additional Information: Met with patient. Introduced self and explained CM role in safe discharge planning. Discharge planning assessment completed.   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 Per the

## 2017-02-06 NOTE — Plan of Care
Problem: Disturbed thought processes  Goal: Thought processes return to optimum baseline as exhibited by  Outcome: Ongoing  Pt sleeping in bed and refused to awaken. Further prompting did by PCA.

## 2017-02-06 NOTE — Psych Progress Note
Report to Adam RN

## 2017-02-06 NOTE — Plan of Care
Problem: Impaired Social Skills  Goal: Demonstrates improved social interactions  Pt has been withdrawn to room with limited interaction with peers and staff. Pt denies SI/HI/AVH. Pt is calm and cooperative, medication compliant with this Clinical research associatewriter.

## 2017-02-06 NOTE — Plan of Care
Problem: Crisis Problem #3: Depression  Goal: Verbalize thoughts and feelings associated with:  DEPRESSION  Outcome: Ongoing  Cheyenne Rhodes is visible throughout the shift interacting well with select peers. Pt does not endorse any depression. Pt is med and meal compliant.  Pt denies SHI/AVH @ this time. Pt has constricted affect and labile behavior. Pt c/o of pain in her lower right area of her neck. Pt states, " Its a bump and I don't have it on the other side, and I want the doctoer to look at it." Upon assessment, a small nodule was felt, no redness, no open area. Writer gave PRN pain medication and anxiety medication and was effective.  Pt is able to make all needs known. No s/s of distress noted. Q 15 minute checks.

## 2017-02-06 NOTE — Consults
Opiate Screen, Urine Not Detected Not Detected    OXYCODONE SCREEN URINE Not Detected Not Detected    Cannabinoid Scrn, Ur Not Detected Not Detected   HCG, Qualitative, Urine    Collection Time: 02/05/17  3:41 AM   Result Value Ref Range    HCG-Urine Negative Negative   POCT Urinalysis w/o Microscopy auto    Collection Time: 02/05/17  3:48 AM   Result Value Ref Range    Color -Ur Yellow     Clarity, UA Clear     Spec Grav 1.020 1.003 - 1.030    pH 7.0 (L) 7.4 - 7.4    Urobilinogen -Ur 1.0 <=2.0 E.U./dL    Nitrite -Ur Negative Negative    Protein-UA Negative Negative mg/dL    Glucose -Ur Negative Negative mg/dL    Blood, UA Negative Negative    WBC, UA Negative Negative    Bilirubin -Ur Negative Negative    Ketones -UR Negative Negative   POCT Urine Pregnancy    Collection Time: 02/05/17  3:49 AM   Result Value Ref Range    Preg Test, Urine (POC) Negative Negative             Assessment and Plan:    Cheyenne Rhodes is a 21 y.o. female with no medical problems;

## 2017-02-06 NOTE — Consults
Platelet Count 199 140 - 440 thou/cu mm    MPV 11.4 9.5 - 11.5 fl    nRBC % 0.0 0.0 - 1.0 %    Absolute NRBC Count 0.00    MANUAL DIFF JAX    Collection Time: 02/05/17  3:37 AM   Result Value Ref Range    Neutrophil % 39 %    Bands % 0 %    Lymphs % 54 %    Monocytes % 5 2 - 6 %    Eos % 2 1 - 4 %    Basos % 0 0 - 1 %    Metamyelocytes % 0 %    Myelocytes % 0 %    Promyelocytes % 0 %    Atypical Lymphocytes % 0 %    Neutrophil Abs 1.70 (L) 1.8 - 8.7 x10E3/uL    Lymphs Abs 2.40 thou/ cu mm    Monos Abs 0.23 x10E3/uL    Eos Abs 0.07 thou/ cu mm    Basos Abs 0.00     Immature Granulocytes Absolute 0.00 <=0.00 x10E3/uL   MORPHOLOGY JAX    Collection Time: 02/05/17  3:37 AM   Result Value Ref Range    Platelet Estimate Normal     Hypochromia Slight     Burr Cells Occasional     Poikilocytes Moderate     Diff Comment #DIGIM    Urinalysis W/Microscopy    Collection Time: 02/05/17  3:41 AM   Result Value Ref Range    Color -Ur Yellow Amber    Clarity, UA Clear Hazy    Specific Gravity, Urine 1.016 1.003 - 1.030    pH, Urine 6.0 4.5 - 8.0    Protein-UA Negative Negative mg/dL    Glucose -Ur Negative Negative mg/dL    Ketones UA Negative Negative mg/dL    Bilirubin -Ur Negative Negative    Blood -Ur Negative Negative    Nitrite -Ur Negative Negative    Urobilinogen -Ur 2.0 (A) Normal mg/dL    Leukocytes -Ur Negative Negative    RBC -Ur 1 0 - 5 /HPF    WBC -Ur 2 0 - 5 /HPF    Squam Epithel, UA <1 Not established /HPF    Bacteria -Ur None seen None seen /HPF    Mucus -Ur Rare 2+ /LPF    ASCORBIC ACID Negative 20 mg/dL   Urine Drugs of Abuse (DOA)    Collection Time: 02/05/17  3:41 AM   Result Value Ref Range    Amphetamine Screen, Urine Not Detected Not Detected    Barbiturate Screen, Urine Not Detected Not Detected    Benzodiazepine Screen, Urine Not Detected Not Detected    Cocaine Metabolites, Ur Not Detected Not Detected    Methadone Screen, Urine Not Detected Not Detected

## 2017-02-07 MED ORDER — TRIPLE ANTIBIOTIC 5-400-5000 EX OINT
Freq: Two times a day (BID) | TOPICAL | Status: DC
Start: 2017-02-07 — End: 2017-02-10

## 2017-02-07 MED ORDER — METRONIDAZOLE 500 MG PO TABS
500 mg | Freq: Two times a day (BID) | ORAL | Status: DC
Start: 2017-02-07 — End: 2017-02-10

## 2017-02-07 MED ORDER — FLUCONAZOLE 150 MG PO TABS
150 mg | Freq: Once | ORAL | Status: CP
Start: 2017-02-07 — End: ?

## 2017-02-07 NOTE — Consults
Done.

## 2017-02-07 NOTE — Plan of Care
Problem: Disturbed thought processes  Goal: Thought processes return to optimum baseline as exhibited by  Outcome: Ongoing  Pt. Observed on the unit alert and oriented times four and is interacting with her peers. Affect is blunted and mood is anxious/depressed. Pt. Denies SI/HI, A/V/H. She is compliant with her medications. Pt. Stated she has been having trouble urinating, she has a small amount of discharge, and has bumps on her vagina. Medical consult placed for pt. To see the doctor in the morning for these complaints. Will continue to monitor for safety and complete 15 minute safety checks.     Problem: Discharge Planning  Goal: Safe effective discharge  Outcome: Ongoing  Pt. Continues to require inpatient psychiatric treatment. No discharge orders at this time.

## 2017-02-07 NOTE — Psych Progress Note
Report given to Richard, RN

## 2017-02-07 NOTE — Plan of Care
Problem: Impaired Social Skills  Goal: Demonstrates improved social interactions  Outcome: Ongoing  Patient attends RT group but remains withdrawn mostly to self.  Patient appears to have difficulty following the tasks @ hand but tries to socialize with peers for assistance.

## 2017-02-08 DIAGNOSIS — Z9119 Patient's noncompliance with other medical treatment and regimen: Secondary | ICD-10-CM

## 2017-02-08 DIAGNOSIS — B373 Candidiasis of vulva and vagina: Secondary | ICD-10-CM

## 2017-02-08 DIAGNOSIS — F319 Bipolar disorder, unspecified: Secondary | ICD-10-CM

## 2017-02-08 DIAGNOSIS — F329 Major depressive disorder, single episode, unspecified: Secondary | ICD-10-CM

## 2017-02-08 MED ORDER — AMOXICILLIN-POT CLAVULANATE 875-125 MG PO TABS
1 | ORAL_TABLET | Freq: Two times a day (BID) | ORAL | Status: DC
Start: 2017-02-08 — End: 2017-02-10

## 2017-02-08 NOTE — Psych Progress Note
INDIVIDUAL THERAPY:Pt discussed reason for psychiatric hospitalization.She reported feeling suicidal because her cousin was said mean things to her. She reported,"my cousin know I am sensitive and she was trying to get next to me". She did not want to share what her cousin said to her.She also reported having a strained relationship with parents.Pt reported no longer feeling depressed.She reported no longer feeling suicidal. She is focused on wanting to get discharge.Discuused appropriate ways of coping with interpersonal relationship. Discussed ways of managing depressive and suicidal thinking. Pt reported appropriate sleep pattern, Pt was receptive to conversation.

## 2017-02-08 NOTE — Plan of Care
Problem: Impaired Social Skills  Goal: Demonstrates improved social interactions  Pt is visible on unit social with peers laughing and joking . patient asked for ativan and was given it as she stated she was feeling a little anxious.pt did not elaborate as to why she was feeling anxious.pt denies S/H/I and A/V/H.

## 2017-02-08 NOTE — Plan of Care
Problem: Crisis Problem #3: Depression  Goal: Treatment Goal: Demonstrate behavioral control of depressive symptoms, verbalize feelings of improved mood/affect, and adopt new coping skills prior to discharge  Short Term   Outcome: Ongoing  Pt denies si/hi/avh/anx.  Pt is med compliant and more social on the unit than previous splits. Pt does require minor limit setting but is otherwise pleasant on the unit.  Pt does not attend all groups.

## 2017-02-08 NOTE — Plan of Care
Problem: Disturbed thought processes  Goal: Thought processes return to optimum baseline as exhibited by  Outcome: Ongoing  Pt sleeping during group therapy called and refused to awaken.

## 2017-02-08 NOTE — Plan of Care
Problem: Impaired Social Skills  Goal: Demonstrates improved social interactions  Outcome: Ongoing  Patient attends RT group.  Patient remains blunted but social and is able to follow/complete the tasks @ hand.

## 2017-02-08 NOTE — Psych Nurses Notes
Lithium level obtained and sent to lab.

## 2017-02-09 MED ORDER — METRONIDAZOLE 500 MG PO TABS
500 mg | Freq: Two times a day (BID) | ORAL | 0 refills | Status: CP
Start: 2017-02-09 — End: 2017-02-11

## 2017-02-09 MED ORDER — OLANZAPINE 10 MG PO TABS
10 mg | Freq: Every evening | ORAL | 1 refills | Status: CP
Start: 2017-02-09 — End: ?

## 2017-02-09 MED ORDER — SERTRALINE HCL 50 MG PO TABS
50 mg | Freq: Every day | ORAL | 1 refills | Status: CP
Start: 2017-02-09 — End: ?

## 2017-02-09 MED ORDER — AMOXICILLIN-POT CLAVULANATE 875-125 MG PO TABS
1 | ORAL_TABLET | Freq: Two times a day (BID) | ORAL | 0 refills | Status: CP
Start: 2017-02-09 — End: 2017-02-11

## 2017-02-09 MED ORDER — LITHIUM CARBONATE 300 MG PO CAPS
300 mg | Freq: Two times a day (BID) | ORAL | 1 refills | Status: CP
Start: 2017-02-09 — End: ?

## 2017-02-09 NOTE — Psych Progress Note
Patient attends RT groups and fully participates.  Patient is anticipating her pending discharge.

## 2017-02-09 NOTE — Psych Progress Note
Pt discharge ordered. Pt given discharge instructions, pt verbalized understanding. Pt denies SI/HI/AVH, pt is calm and cooperative, NAD. Pt signed for all of her belongings with RN and with security. [facility]

## 2017-02-09 NOTE — Plan of Care
Problem: Impaired Social Skills  Goal: Demonstrates improved social interactions  Pt playful with nurse comes up to nurses station leaning over counter talking.pt takes well to gentle redirection.pt denies S/H/I and A/V/H.pt focused on discharge.pt tells nurse she is glad she has started taking her meds regularly

## 2017-02-09 NOTE — Progress Notes
Discharge: Pt is being discharged today. Pt to return home. Pt referred to [facility] for med mgmt. Taxi to transport. Pt did not want to wait for the medicaid taxi. Pt in agreement with the plan.

## 2017-02-10 ENCOUNTER — Inpatient Hospital Stay: Admit: 2017-02-10 | Discharge: 2017-02-10

## 2017-02-10 DIAGNOSIS — N898 Other specified noninflammatory disorders of vagina: Secondary | ICD-10-CM

## 2017-02-10 DIAGNOSIS — N941 Unspecified dyspareunia: Secondary | ICD-10-CM

## 2017-02-10 DIAGNOSIS — F319 Bipolar disorder, unspecified: Secondary | ICD-10-CM

## 2017-02-10 DIAGNOSIS — Z79899 Other long term (current) drug therapy: Secondary | ICD-10-CM

## 2017-02-10 DIAGNOSIS — F329 Major depressive disorder, single episode, unspecified: Secondary | ICD-10-CM

## 2017-02-10 DIAGNOSIS — R102 Pelvic and perineal pain: Principal | ICD-10-CM

## 2017-02-10 DIAGNOSIS — Z9119 Patient's noncompliance with other medical treatment and regimen: Secondary | ICD-10-CM

## 2017-02-10 DIAGNOSIS — Z975 Presence of (intrauterine) contraceptive device: Secondary | ICD-10-CM

## 2017-02-10 DIAGNOSIS — Z113 Encounter for screening for infections with a predominantly sexual mode of transmission: Secondary | ICD-10-CM

## 2017-02-10 DIAGNOSIS — N926 Irregular menstruation, unspecified: Secondary | ICD-10-CM

## 2017-02-10 DIAGNOSIS — B373 Candidiasis of vulva and vagina: Secondary | ICD-10-CM

## 2017-02-10 NOTE — ED Provider Notes
History     Chief Complaint   Patient presents with   ? Female Pelvic Pain       HPI    Allergies   Allergen Reactions   ? Chocolate Angioedema   ? Risperidone Other (See Comments)     "I dont really know, I just know I'm allergic"       Patient's Medications   New Prescriptions    No medications on file   Previous Medications    LITHIUM 300 MG PO CAPSULE    Take 1 capsule by mouth 2 times daily (with meals). Reasons: Manic-Depression    OLANZAPINE (ZYPREXA) 10 MG PO TABLET    Take 1 tablet by mouth nightly at bedtime. Reasons: Manic-Depression    SERTRALINE (ZOLOFT) 50 MG PO TABLET    Take 1 tablet by mouth daily. Reasons: Depression   Modified Medications    No medications on file   Discontinued Medications    AMOXICILLIN-CLAVULANATE (AUGMENTIN) 875-125 MG PO TABLET    Take 1 tablet by mouth every 12 hours for 4 days. Reasons: Urinary Tract Infection    METRONIDAZOLE (FLAGYL) 500 MG PO TABLET    Take 1 tablet by mouth every 12 hours for 5 days. Reasons: Vaginosis caused by Bacteria       Past Medical History:   Diagnosis Date   ? Bipolar 1 disorder    ? Bipolar I disorder with depression 02/05/2017   ? Candidal vulvovaginitis 02/08/2017   ? Depression    ? Non-adherence to medical treatment 02/05/2017   ? Personal history of noncompliance with medical treatment, presenting hazards to health        History reviewed. No pertinent surgical history.    History reviewed. No pertinent family history.    Social History     Social History   ? Marital status: Single     Spouse name: N/A   ? Number of children: N/A   ? Years of education: N/A     Social History Main Topics   ? Smoking status: Never Smoker   ? Smokeless tobacco: None   ? Alcohol use No   ? Drug use: No   ? Sexual activity: Not Asked     Other Topics Concern   ? None     Social History Narrative       Review of Systems    Physical Exam     ED Triage Vitals   BP 02/10/17 1643 118/73   Pulse 02/10/17 1643 72   Resp 02/10/17 1643 18

## 2017-02-10 NOTE — ED Provider Notes
hazards to health        History reviewed. No pertinent surgical history.    History reviewed. No pertinent family history.    Social History     Social History   ? Marital status: Single     Spouse name: N/A   ? Number of children: N/A   ? Years of education: N/A     Social History Main Topics   ? Smoking status: Never Smoker   ? Smokeless tobacco: None   ? Alcohol use No   ? Drug use: No   ? Sexual activity: Not Asked     Other Topics Concern   ? None     Social History Narrative       Review of Systems   Genitourinary: Negative for pelvic pain.       Physical Exam     ED Triage Vitals   BP 02/10/17 1643 118/73   Pulse 02/10/17 1643 72   Resp 02/10/17 1643 18   Temp 02/10/17 1643 36.9 ?C (98.4 ?F)   Temp src 02/10/17 1643 Oral   Height --    Weight --    SpO2 02/10/17 1643 98 %   BMI (Calculated) --              Physical Exam    Differential DDx: ***    Is this an Emergent Medical Condition? {SH ED EMERGENT MEDICAL CONDITION:[id number]}  409.901 FS  641.19 FS  627.732 (16) FS    ED Workup   Procedures    Labs:  -   POCT URINALYSIS AUTO W/O MICROSCOPY - Abnormal        Result Value Ref Range    Color -Ur Yellow      Clarity, UA Clear      Spec Grav 1.025  1.003 - 1.030    pH 7.0 (*) 7.4 - 7.4    Urobilinogen -Ur 1.0  <=2.0 E.U./dL    Nitrite -Ur Negative  Negative    Protein-UA 30.0 (*) Negative mg/dL    Glucose -Ur Negative  Negative mg/dL    Blood, UA Moderate (*) Negative    WBC, UA Negative  Negative    Bilirubin -Ur Negative  Negative    Ketones -UR Negative  Negative   POCT URINE PREGNANCY - Normal    Preg Test, Urine (POC) Negative  Negative   CHLAMYDIA/ GONORRHEA DETECTIO*   POCT URINE PREGNANCY         Imaging (Read by ED Provider):  {Imaging [phone]}      EKG (Read by ED Provider):  {EKG findings:[phone]}        ED Course & Re-Evaluation     ED Course   Comment By Time   Vaginal pain.  Skyler IUD in place.   +Vaginal dc x2d, but none today

## 2017-02-10 NOTE — ED Provider Notes
History reviewed. No pertinent surgical history.    History reviewed. No pertinent family history.    Social History     Social History   ? Marital status: Single     Spouse name: N/A   ? Number of children: N/A   ? Years of education: N/A     Social History Main Topics   ? Smoking status: Never Smoker   ? Smokeless tobacco: None   ? Alcohol use No   ? Drug use: No   ? Sexual activity: Not Asked     Other Topics Concern   ? None     Social History Narrative       Review of Systems   Constitutional: Negative for fever.   Respiratory: Negative for shortness of breath.    Cardiovascular: Negative for chest pain.   Gastrointestinal: Negative for nausea, vomiting, abdominal pain, diarrhea, constipation and abdominal distention.   Genitourinary: Positive for vaginal bleeding and vaginal discharge. Negative for dysuria, urgency, hematuria, flank pain and pelvic pain.   All other systems reviewed and are negative.      Physical Exam       ED Triage Vitals   BP 02/10/17 1643 118/73   Pulse 02/10/17 1643 72   Resp 02/10/17 1643 18   Temp 02/10/17 1643 36.9 ?C (98.4 ?F)   Temp src 02/10/17 1643 Oral   Height --    Weight --    SpO2 02/10/17 1643 98 %   BMI (Calculated) --              Physical Exam   Constitutional: She is oriented to person, place, and time. She appears well-developed and well-nourished. No distress.   Appears well.  No distress.    HENT:   Head: Normocephalic.   Right Ear: External ear normal.   Left Ear: External ear normal.   Eyes: Conjunctivae are normal. Pupils are equal, round, and reactive to light.   Neck: Normal range of motion. Neck supple.   Cardiovascular: Normal rate, regular rhythm and normal heart sounds.  Exam reveals no gallop and no friction rub.    No murmur heard.  Pulmonary/Chest: Effort normal and breath sounds normal. She has no wheezes. She has no rales.   Abdominal: Soft. Bowel sounds are normal. She exhibits no distension and

## 2017-02-10 NOTE — ED Notes
Time of discharge: 1750  PM., Patient discharged to  Home.  Patient discharged  ambulatory. to exit with belongings in  Stable condition.  Patient escorted by  no one., Written discharge instructions given to  patient.  Patient/recipient  verbalizes discharge instructions.

## 2017-02-10 NOTE — ED Provider Notes
History     Chief Complaint   Patient presents with   ? Female Pelvic Pain       HPI Comments: 21 yo F w/ pmh depression/bipolar presents with vaginal pain. Pt reports that she had a IUD placed several years back. Now has sharp pain with intercourse. She says that her boyfriend can feel it as well. She says that she has some vaginal dc x2d but has resolved. She also says she is currently on her period.   Reports having a "bump" on L labia.   Denies any fevers, dysuria, urgency/frequency, hematuria, constipation, diarrhea    OB: G1P0010  GYN: No hx of infections  Menstrual: irregular     The history is provided by the patient.   Female Pelvic Pain   Primary symptoms include discharge, genital pain, vaginal bleeding.  Primary symptoms include no pelvic pain, no dysuria. There has been no fever. Pertinent negatives include no abdominal pain, no constipation, no diarrhea, no nausea and no vomiting.       Allergies   Allergen Reactions   ? Chocolate Angioedema   ? Risperidone Other (See Comments)     "I dont really know, I just know I'm allergic"       Discharge Medication List as of 02/10/2017  5:41 PM      CONTINUE these medications which have NOT CHANGED    Details   lithium 300 MG PO Capsule Take 1 capsule by mouth 2 times daily (with meals). Reasons: Manic-DepressionDisp-60 capsule, R-1, E-Prescribing      OLANZapine (ZYPREXA) 10 MG PO Tablet Take 1 tablet by mouth nightly at bedtime. Reasons: Manic-DepressionDisp-30 tablet, R-1, E-Prescribing      sertraline (ZOLOFT) 50 MG PO Tablet Take 1 tablet by mouth daily. Reasons: DepressionDisp-30 tablet, R-1, E-Prescribing             Past Medical History:   Diagnosis Date   ? Bipolar 1 disorder    ? Bipolar I disorder with depression 02/05/2017   ? Candidal vulvovaginitis 02/08/2017   ? Depression    ? Non-adherence to medical treatment 02/05/2017   ? Personal history of noncompliance with medical treatment, presenting hazards to health

## 2017-02-10 NOTE — ED Provider Notes
no mass. There is no tenderness. There is no rebound and no guarding.   Genitourinary: Cervix exhibits no motion tenderness, no discharge and no friability. No tenderness or bleeding in the vagina. No vaginal discharge found.   Genitourinary Comments: No lesions or cyst palpated. No batholin cyst.   IUD string in place.   No CMT, frability, adnexal tenderness.  No BV, trich, yeast on microscopy   Musculoskeletal: Normal range of motion.   Neurological: She is alert and oriented to person, place, and time.   Skin: Skin is warm and dry.   Psychiatric: She has a normal mood and affect.   Nursing note and vitals reviewed.      Differential DDx: IUD malfunction, endometritis, UTI, STD, and others    Is this an Emergent Medical Condition? Yes - Severe Pain/Acute Onset of Symptons  409.901 FS  641.19 FS  627.732 (16) FS    ED Workup   Procedures    Labs:  -   POCT URINALYSIS AUTO W/O MICROSCOPY - Abnormal        Result Value Ref Range    Color -Ur Yellow      Clarity, UA Clear      Spec Grav 1.025  1.003 - 1.030    pH 7.0 (*) 7.4 - 7.4    Urobilinogen -Ur 1.0  <=2.0 E.U./dL    Nitrite -Ur Negative  Negative    Protein-UA 30.0 (*) Negative mg/dL    Glucose -Ur Negative  Negative mg/dL    Blood, UA Moderate (*) Negative    WBC, UA Negative  Negative    Bilirubin -Ur Negative  Negative    Ketones -UR Negative  Negative   POCT URINE PREGNANCY - Normal    Preg Test, Urine (POC) Negative  Negative   CHLAMYDIA/ GONORRHEA DETECTIO*   POCT URINE PREGNANCY         Imaging (Read by ED Provider):  No results found.      EKG (Read by ED Provider):  not applicable        ED Course & Re-Evaluation     ED Course   Comment By Time   Vaginal pain.  Skyler IUD in place.   +Vaginal dc x2d, but none today  Denies fever/chills Mui, Solon AugustaMatthew Kwan Ho, MD 03/09 1722   Pelvic performed with nurse Geraldine Contrasee.  Unable to palpate labial lesion. No batholin cyst  IUD string visualized.    Call back number [phone] for GC/chlamydia Mui, Solon AugustaMatthew Kwan Ho, MD

## 2017-02-10 NOTE — ED Provider Notes
Temp 02/10/17 1643 36.9 ?C (98.4 ?F)   Temp src 02/10/17 1643 Oral   Height --    Weight --    SpO2 02/10/17 1643 98 %   BMI (Calculated) --              Physical Exam    Differential DDx: ***    Is this an Emergent Medical Condition? {SH ED EMERGENT MEDICAL CONDITION:[id number]}  409.901 FS  641.19 FS  627.732 (16) FS    ED Workup   Procedures    Labs:  -   POCT URINALYSIS AUTO W/O MICROSCOPY - Abnormal        Result Value Ref Range    Color -Ur Yellow      Clarity, UA Clear      Spec Grav 1.025  1.003 - 1.030    pH 7.0 (*) 7.4 - 7.4    Urobilinogen -Ur 1.0  <=2.0 E.U./dL    Nitrite -Ur Negative  Negative    Protein-UA 30.0 (*) Negative mg/dL    Glucose -Ur Negative  Negative mg/dL    Blood, UA Moderate (*) Negative    WBC, UA Negative  Negative    Bilirubin -Ur Negative  Negative    Ketones -UR Negative  Negative   POCT URINE PREGNANCY - Normal    Preg Test, Urine (POC) Negative  Negative   CHLAMYDIA/ GONORRHEA DETECTIO*   POCT URINE PREGNANCY         Imaging (Read by ED Provider):  {Imaging [phone]}      EKG (Read by ED Provider):  {EKG findings:[phone]}        ED Course & Re-Evaluation     ED Course   Comment By Time   Vaginal pain.  Skyler IUD in place.   +Vaginal dc x2d, but none today  Denies fever/chills Mui, Solon AugustaMatthew Kwan Ho, MD 03/09 1722   Pelvic performed with nurse Geraldine Contrasee.  Unable to palpate labial lesion. No batholin cyst  IUD string visualized.    Call back number [phone] for GC/chlamydia Mui, Solon AugustaMatthew Kwan Ho, MD 03/09 1730   Mod blood on UA - patient currently on period.     Discussed f/u with OBGYN and pcp.  Discussed return if worsening pain, dysuria, urgency/frequency, hematuria. Effie BerkshireMui, Matthew Kwan Ho, MD 03/09 [phone]         MDM   Decide to obtain history from someone other than the patient: No    Decide to obtain previous medical records: No    Clinical Lab Test(s): Ordered and Reviewed    Diagnostic Tests (Radiology, EKG): N/A

## 2017-02-10 NOTE — ED Provider Notes
History     Chief Complaint   Patient presents with   ? Female Pelvic Pain       HPI Comments: 21 yo F w/ pmh depression/bipolar presents with vaginal pain. Pt reports that she had a IUD placed several years back. Now has sharp pain with intercourse. She says that her boyfriend can feel it as well. She says that she has some vaginal dc x2d but has resolved. She also says she is currently on her period.   Reports having a "bump" on L labia.   Denies any fevers, dysuria, urgency/frequency.    OB: G1P0010  GYN: No hx of infections  Menstrual: irregular     The history is provided by the patient.   Female Pelvic Pain   Primary symptoms include discharge, genital pain.  Primary symptoms include no pelvic pain. There has been no fever.       Allergies   Allergen Reactions   ? Chocolate Angioedema   ? Risperidone Other (See Comments)     "I dont really know, I just know I'm allergic"       Patient's Medications   New Prescriptions    No medications on file   Previous Medications    LITHIUM 300 MG PO CAPSULE    Take 1 capsule by mouth 2 times daily (with meals). Reasons: Manic-Depression    OLANZAPINE (ZYPREXA) 10 MG PO TABLET    Take 1 tablet by mouth nightly at bedtime. Reasons: Manic-Depression    SERTRALINE (ZOLOFT) 50 MG PO TABLET    Take 1 tablet by mouth daily. Reasons: Depression   Modified Medications    No medications on file   Discontinued Medications    AMOXICILLIN-CLAVULANATE (AUGMENTIN) 875-125 MG PO TABLET    Take 1 tablet by mouth every 12 hours for 4 days. Reasons: Urinary Tract Infection    METRONIDAZOLE (FLAGYL) 500 MG PO TABLET    Take 1 tablet by mouth every 12 hours for 5 days. Reasons: Vaginosis caused by Bacteria       Past Medical History:   Diagnosis Date   ? Bipolar 1 disorder    ? Bipolar I disorder with depression 02/05/2017   ? Candidal vulvovaginitis 02/08/2017   ? Depression    ? Non-adherence to medical treatment 02/05/2017   ? Personal history of noncompliance with medical treatment, presenting

## 2017-02-10 NOTE — ED Triage Notes
Patient ambulated into triage with a steady gait, alert and appropriate for age in no apparent distress.  Patient states she has an IUD in and when her and her boyfriend have sex, he complains of pain. Pt states "I am only supposed to have this IUD in for three years and it's been three years." Pt also reports intermittent lower abd pain and a bump on the left inner labia. Denies vaginal bleeding and/or discharge.

## 2017-02-10 NOTE — ED Provider Notes
03/09 1730   Mod blood on UA - patient currently on period.     Discussed f/u with OBGYN and pcp.  Discussed return if worsening pain, dysuria, urgency/frequency, hematuria. Effie BerkshireMui, Matthew Kwan Ho, MD 03/09 [phone]         MDM   Decide to obtain history from someone other than the patient: No    Decide to obtain previous medical records: No    Clinical Lab Test(s): Ordered and Reviewed    Diagnostic Tests (Radiology, EKG): N/A    Independent Visualization (ED US, Wet Prep, Other): Yes - Documented in ED Provider Note    Discussed patient with NON-ED Provider: None      ED Disposition   ED Disposition: Discharge      ED Clinical Impression   ED Clinical Impression:   IUD (intrauterine device) in place  Vaginal pain  Dyspareunia in female      ED Patient Status   Patient Status:   Good        ED Medical Evaluation Initiated   Medical Evaluation Initiated:   Yes, filed at 02/10/17 1712  by Effie BerkshireMui, Matthew Kwan Ho, MD             Mui, Solon AugustaMatthew Kwan Ho, MD  Resident  02/10/17 [phone]

## 2017-02-10 NOTE — ED Provider Notes
Independent Visualization (ED US, Wet Prep, Other): Yes - Documented in ED Provider Note    Discussed patient with NON-ED Provider: None      ED Disposition   ED Disposition: No ED Disposition Set      ED Clinical Impression   ED Clinical Impression:   No Clinical Impression Set      ED Patient Status   Patient Status:   [phone]}        ED Medical Evaluation Initiated   Medical Evaluation Initiated:   Yes, filed at 02/10/17 1712  by Effie BerkshireMui, Matthew Kwan Ho, MD

## 2017-02-10 NOTE — ED Provider Notes
Denies fever/chills Cheyenne Rhodes, Cheyenne AugustaMatthew Kwan Ho, Cheyenne Rhodes 03/09 1722   Pelvic performed with nurse Geraldine Contrasee.  Unable to palpate labial lesion. No batholin cyst  IUD string visualized.    Call back number [phone] for GC/chlamydia Cheyenne Rhodes, Cheyenne AugustaMatthew Kwan Ho, Cheyenne Rhodes 03/09 1730   Mod blood on UA - patient currently on period.     Discussed f/u with OBGYN and pcp.  Discussed return if worsening pain, dysuria, urgency/frequency, hematuria. Cheyenne BerkshireMui, Cheyenne Kwan Ho, Cheyenne Rhodes 03/09 [phone]         MDM   Decide to obtain history from someone other than the patient: No    Decide to obtain previous medical records: No    Clinical Lab Test(s): Ordered and Reviewed    Diagnostic Tests (Radiology, EKG): N/A    Independent Visualization (ED US, Wet Prep, Other): Yes - Documented in ED Provider Note    Discussed patient with NON-ED Provider: None      ED Disposition   ED Disposition: No ED Disposition Set      ED Clinical Impression   ED Clinical Impression:   No Clinical Impression Set      ED Patient Status   Patient Status:   [phone]}        ED Medical Evaluation Initiated   Medical Evaluation Initiated:   Yes, filed at 02/10/17 1712  by Cheyenne BerkshireMui, Cheyenne Kwan Ho, Cheyenne Rhodes

## 2017-02-15 ENCOUNTER — Emergency Department: Admit: 2017-02-15 | Discharge: 2017-02-15

## 2017-02-15 ENCOUNTER — Inpatient Hospital Stay: Admit: 2017-02-15 | Discharge: 2017-02-15

## 2017-02-15 DIAGNOSIS — R1084 Generalized abdominal pain: Secondary | ICD-10-CM

## 2017-02-15 DIAGNOSIS — Z79899 Other long term (current) drug therapy: Secondary | ICD-10-CM

## 2017-02-15 DIAGNOSIS — Z975 Presence of (intrauterine) contraceptive device: Secondary | ICD-10-CM

## 2017-02-15 DIAGNOSIS — N939 Abnormal uterine and vaginal bleeding, unspecified: Principal | ICD-10-CM

## 2017-02-15 DIAGNOSIS — F329 Major depressive disorder, single episode, unspecified: Secondary | ICD-10-CM

## 2017-02-15 DIAGNOSIS — R1031 Right lower quadrant pain: Secondary | ICD-10-CM

## 2017-02-15 DIAGNOSIS — R110 Nausea: Secondary | ICD-10-CM

## 2017-02-15 DIAGNOSIS — Z9119 Patient's noncompliance with other medical treatment and regimen: Secondary | ICD-10-CM

## 2017-02-15 DIAGNOSIS — B373 Candidiasis of vulva and vagina: Secondary | ICD-10-CM

## 2017-02-15 DIAGNOSIS — F319 Bipolar disorder, unspecified: Secondary | ICD-10-CM

## 2017-02-15 MED ORDER — IBUPROFEN 400 MG PO TABS
400 mg | Freq: Once | ORAL | Status: CP
Start: 2017-02-15 — End: ?

## 2017-02-15 NOTE — ED Provider Notes
Pulmonary/Chest: Effort normal and breath sounds normal.   Abdominal: Soft. Bowel sounds are normal. She exhibits no distension. There is tenderness. There is guarding.   Tenderness present in RLQ and LLQ   Genitourinary: Vagina normal. No vaginal discharge found.   Genitourinary Comments: Vaginal bleeding, no cervical motion tenderness, IUD in place with string visualization. No discharge.   Musculoskeletal: She exhibits no edema or tenderness.   Neurological: She is alert and oriented to person, place, and time. No cranial nerve deficit.   Skin: Skin is warm and dry. No rash noted.   Nursing note and vitals reviewed.      Differential DDx: PID, ovarian torsion, IUD pain    Is this an Emergent Medical Condition? Yes - Severe Pain/Acute Onset of Symptons  409.901 FS  641.19 FS  627.732 (16) FS    ED Workup   Procedures    Labs:  - - No data to display      Imaging (Read by ED Provider):  {Imaging [phone]}      EKG (Read by ED Provider):  not applicable        ED Course & Re-Evaluation     ED Course     Patient seen and examined. OB consulted for appointment for IUD reveomal. Will perform pelvic exam   Dellie CatholicColleen Timmons, DO 4:45 PM 02/15/2017    Spoke with OB who recommede pelvic US before follow up in clinic   Cheyenne Timmons, DO 4:48 PM 02/15/2017    Slides examined, no evidence of trichamonas. Pelvic U/S ordered.   [name], DO 5:07 PM 02/15/2017    U/A within normal limits. Urine pregnncy negative will order Motrin for pain.   [name], DO 5:36 PM 02/15/2017    Patient in U/S   [name], DO 5:44 PM 02/15/2017        MDM   Decide to obtain history from someone other than the patient: No    Decide to obtain previous medical records: Yes - The review of the records selected below is documented in the ED Note : ( Prior ED visit )    Clinical Lab Test(s): Ordered and Reviewed    Diagnostic Tests (Radiology, EKG): Ordered and Reviewed

## 2017-02-15 NOTE — ED Provider Notes
Pulmonary/Chest: Effort normal and breath sounds normal.   Nursing note and vitals reviewed.      Differential DDx: PID, ovarian torsion, IUD pain    Is this an Emergent Medical Condition? {SH ED EMERGENT MEDICAL CONDITION:[id number]}  409.901 FS  641.19 FS  627.732 (16) FS    ED Workup   Procedures    Labs:  - - No data to display      Imaging (Read by ED Provider):  {Imaging findings:[phone]}      EKG (Read by ED Provider):  {EKG [phone]}        ED Course & Re-Evaluation     ED Course     Patient seen and examined. OB consulted for appointment for IUD reveomal. Will perform pelvic exam Dellie CatholicColleen Timmons, DO 4:45 PM 02/15/2017    Spoke with OB who recommede pelvic US before follow up in clinic [place], [place] 4:48 PM 02/15/2017    Slides examined, no evidence of trichamonas. Pelvic U/S ordered. [name], DO 5:07 PM 02/15/2017        MDM   Decide to obtain history from someone other than the patient: {SH ED Lamonte SakaiJX MDM - OBTAIN OZHYQMV:78469}HISTORY:28378}    Decide to obtain previous medical records: [facility]{SH ED Lamonte SakaiJX MDM - PREVIOUS MED REC - NO GEX:52841}YES:28380}    Clinical Lab Test(s): {SH ED Lamonte SakaiJX MDM ORDERED AND REVIEWED:28124}    Diagnostic Tests (Radiology, EKG): {SH ED Lamonte SakaiJX MDM ORDERED AND REVIEWED:28124}    Independent Visualization (ED US, Wet Prep, Other): {SH ED Lamonte SakaiJX MDM NO YES [id number]:72536}WILDCARD:26444}    Discussed patient with NON-ED Provider: {SH ED Lamonte SakaiJX MDM - ANOTHER PROVIDER:28381}      ED Disposition   ED Disposition: No ED Disposition Set      ED Clinical Impression   ED Clinical Impression:   No Clinical Impression Set      ED Patient Status   Patient Status:   {[facility]:[phone]}        ED Medical Evaluation Initiated   Medical Evaluation Initiated:   Yes, filed at 02/15/17 1621  by Joneen RoachFishe, Jennifer, MD

## 2017-02-15 NOTE — ED Notes
Pt to U/S via wheel chair

## 2017-02-15 NOTE — ED Provider Notes
expresses understanding and agrees with plan of care.   Opal SidlesAMANDA C Roycik, MD 7:11 PM 02/15/2017       MDM   Decide to obtain history from someone other than the patient: No    Decide to obtain previous medical records: Yes - The review of the records selected below is documented in the ED Note : ( Prior ED visit )    Clinical Lab Test(s): Ordered and Reviewed    Diagnostic Tests (Radiology, EKG): Ordered and Reviewed    Independent Visualization (ED US, Wet Prep, Other): Yes - Documented in ED Provider Note    Discussed patient with NON-ED Provider: OB       ED Disposition   ED Disposition: Discharge      ED Clinical Impression   ED Clinical Impression:   Vaginal bleeding  Generalized abdominal pain  IUD (intrauterine device) in place      ED Patient Status   Patient Status:   Good        ED Medical Evaluation Initiated   Medical Evaluation Initiated:   Yes, filed at 02/15/17 1621  by Joneen RoachFishe, Jennifer, MD             Opal Sidlesoycik, Amanda C, MD  Resident  02/15/17 [id number]

## 2017-02-15 NOTE — ED Triage Notes
Pt arrived via [facility] complaining of abdominal pain and vaginal bleeding starting 5 hours ago. Pt reports it is not time for her normal menstrual cycle to start. She denies any chance of pregnancy. Pt endorses nausea, denies any vomiting or diarrhea.

## 2017-02-15 NOTE — ED Provider Notes
Pulmonary/Chest: Effort normal and breath sounds normal.   Abdominal: Soft. Bowel sounds are normal. She exhibits no distension. There is tenderness. There is guarding.   Tenderness present in RLQ and LLQ   Genitourinary: Vagina normal. No vaginal discharge found.   Genitourinary Comments: Vaginal bleeding, no cervical motion tenderness, IUD in place with string visualization. No discharge.   Musculoskeletal: She exhibits no edema or tenderness.   Neurological: She is alert and oriented to person, place, and time. No cranial nerve deficit.   Skin: Skin is warm and dry. No rash noted.   Nursing note and vitals reviewed.      Differential DDx: PID, ovarian torsion, IUD pain    Is this an Emergent Medical Condition? {SH ED EMERGENT MEDICAL CONDITION:[phone]}  409.901 FS  641.19 FS  627.732 (16) FS    ED Workup   Procedures    Labs:  - - No data to display      Imaging (Read by ED Provider):  {Imaging findings:[phone]}      EKG (Read by ED Provider):  {EKG findings:[id number]}        ED Course & Re-Evaluation     ED Course     Patient seen and examined. OB consulted for appointment for IUD reveomal. Will perform pelvic exam Dellie CatholicColleen Timmons, DO 4:45 PM 02/15/2017    Spoke with OB who recommede pelvic US before follow up in clinic [place], [place] 4:48 PM 02/15/2017    Slides examined, no evidence of trichamonas. Pelvic U/S ordered. [name], DO 5:07 PM 02/15/2017    U/A within normal limits. Urine pregnncy negative will order motrin for pain [name], DO 5:36 PM 02/15/2017    Patient in U/S [name], DO 5:44 PM 02/15/2017            MDM   Decide to obtain history from someone other than the patient: {SH ED Lamonte SakaiJX MDM - OBTAIN ZOXWRUE:45409}HISTORY:28378}    Decide to obtain previous medical records: [facility]{SH ED Lamonte SakaiJX MDM - PREVIOUS MED REC - NO WJX:91478}YES:28380}    Clinical Lab Test(s): {SH ED Lamonte SakaiJX MDM ORDERED AND REVIEWED:28124}    Diagnostic Tests (Radiology, EKG): {SH ED Lamonte SakaiJX MDM ORDERED AND REVIEWED:28124}

## 2017-02-15 NOTE — ED Provider Notes
?   Bipolar I disorder with depression 02/05/2017   ? Candidal vulvovaginitis 02/08/2017   ? Depression    ? Non-adherence to medical treatment 02/05/2017   ? Personal history of noncompliance with medical treatment, presenting hazards to health        History reviewed. No pertinent surgical history.    History reviewed. No pertinent family history.    Social History     Social History   ? Marital status: Single     Spouse name: N/A   ? Number of children: N/A   ? Years of education: N/A     Social History Main Topics   ? Smoking status: Never Smoker   ? Smokeless tobacco: None   ? Alcohol use No   ? Drug use: No   ? Sexual activity: Not Asked     Other Topics Concern   ? None     Social History Narrative       Review of Systems   Constitutional: Negative for fever, chills and fatigue.   HENT: Negative for nasal congestion and sore throat.    Eyes: Negative for redness and itching.   Respiratory: Negative for cough and shortness of breath.    Cardiovascular: Negative for chest pain.   Gastrointestinal: Positive for nausea and abdominal pain. Negative for diarrhea.   Genitourinary: Positive for vaginal bleeding. Negative for dysuria and vaginal discharge.   Skin: Negative for rash.   Neurological: Negative for dizziness and headaches.       Physical Exam       ED Triage Vitals   BP 02/15/17 1609 131/79   Pulse 02/15/17 1609 85   Resp 02/15/17 1609 16   Temp 02/15/17 1609 36.9 ?C (98.5 ?F)   Temp src 02/15/17 1609 Oral   Height 02/15/17 1609 1.702 m   Weight 02/15/17 1609 69.4 kg   SpO2 02/15/17 1609 100 %   BMI (Calculated) 02/15/17 1609 24.01             Physical Exam   Constitutional: She is oriented to person, place, and time. She appears well-developed and well-nourished.   HENT:   Head: Normocephalic and atraumatic.   Mouth/Throat: Oropharynx is clear and moist.   Eyes: Conjunctivae are normal. Pupils are equal, round, and reactive to light.   Neck: Neck supple.   Cardiovascular: Normal rate and regular rhythm.

## 2017-02-15 NOTE — ED Provider Notes
Pulmonary/Chest: Effort normal and breath sounds normal.   Abdominal: Soft. Bowel sounds are normal. She exhibits no distension. There is tenderness. There is guarding.   Tenderness present in RLQ and LLQ   Genitourinary: Vagina normal. No vaginal discharge found.   Genitourinary Comments: Vaginal bleeding, no cervical motion tenderness, IUD in place with string visualization. No discharge.   Musculoskeletal: She exhibits no edema or tenderness.   Neurological: She is alert and oriented to person, place, and time. No cranial nerve deficit.   Skin: Skin is warm and dry. No rash noted.   Nursing note and vitals reviewed.      Differential DDx: PID, ovarian torsion, IUD pain    Is this an Emergent Medical Condition? Yes - Severe Pain/Acute Onset of Symptons  409.901 FS  641.19 FS  627.732 (16) FS    ED Workup   Procedures    Labs:  -   POCT URINALYSIS AUTO W/O MICROSCOPY - Abnormal        Result Value Ref Range    Color -Ur Yellow      Clarity, UA Hazy      Spec Grav 1.030  1.003 - 1.030    pH 6.0 (*) 7.4 - 7.4    Urobilinogen -Ur 1.0  <=2.0 E.U./dL    Nitrite -Ur Negative  Negative    Protein-UA Trace (*) Negative mg/dL    Glucose -Ur Negative  Negative mg/dL    Blood, UA Large (*) Negative    WBC, UA Negative  Negative    Bilirubin -Ur Negative  Negative    Ketones -UR Trace (*) Negative   POCT URINE PREGNANCY - Normal    Preg Test, Urine (POC) Negative  Negative   POCT URINE PREGNANCY   POCT URINALYSIS AUTO W/O MICROSCOPY         Imaging (Read by ED Provider):  Per Radiology: [place] Pelvic Transabdominal & Transvaginal W Doppler    Result Date: 02/15/2017  PROCEDURE:  US PELVIC TRANSABDOMINAL & TRANSVAGINAL W DOPPLER ORDERING HISTORY:  Pain ADDITIONAL HISTORY: None COMPARISON: None TECHNIQUE: Transabdominal pelvic ultrasound was initially performed with limited visualization of the endometrium and/or adnexal structures.  Therefore, transvaginal pelvic ultrasound was performed for better evaluation.

## 2017-02-15 NOTE — ED Provider Notes
?   Bipolar I disorder with depression 02/05/2017   ? Candidal vulvovaginitis 02/08/2017   ? Depression    ? Non-adherence to medical treatment 02/05/2017   ? Personal history of noncompliance with medical treatment, presenting hazards to health        History reviewed. No pertinent surgical history.    History reviewed. No pertinent family history.    Social History     Social History   ? Marital status: Single     Spouse name: N/A   ? Number of children: N/A   ? Years of education: N/A     Social History Main Topics   ? Smoking status: Never Smoker   ? Smokeless tobacco: None   ? Alcohol use No   ? Drug use: No   ? Sexual activity: Not Asked     Other Topics Concern   ? None     Social History Narrative       Review of Systems   Constitutional: Negative for fever, chills and fatigue.   HENT: Negative for nasal congestion and sore throat.    Eyes: Negative for redness and itching.   Respiratory: Negative for cough and shortness of breath.    Cardiovascular: Negative for chest pain.   Gastrointestinal: Positive for nausea and abdominal pain. Negative for diarrhea.   Genitourinary: Positive for vaginal bleeding. Negative for dysuria and vaginal discharge.   Skin: Negative for rash.   Neurological: Negative for dizziness and headaches.       Physical Exam     ED Triage Vitals   BP 02/15/17 1609 131/79   Pulse 02/15/17 1609 85   Resp 02/15/17 1609 16   Temp 02/15/17 1609 36.9 ?C (98.5 ?F)   Temp src 02/15/17 1609 Oral   Height 02/15/17 1609 1.702 m   Weight 02/15/17 1609 69.4 kg   SpO2 02/15/17 1609 100 %   BMI (Calculated) 02/15/17 1609 24.01             Physical Exam   Constitutional: She is oriented to person, place, and time. She appears well-developed and well-nourished.   HENT:   Head: Normocephalic and atraumatic.   Mouth/Throat: Oropharynx is clear and moist.   Eyes: Conjunctivae are normal. Pupils are equal, round, and reactive to light.   Neck: Neck supple.   Cardiovascular: Normal rate and regular rhythm.

## 2017-02-15 NOTE — ED Provider Notes
appointment. Return precautions include worsening abdominal pain, increased bleeding, or dizziness. Patient expresses understanding and agrees with plan of care.   Cheyenne SidlesAMANDA C Roycik, MD 7:11 PM 02/15/2017       MDM   Decide to obtain history from someone other than the patient: No    Decide to obtain previous medical records: Yes - The review of the records selected below is documented in the ED Note : ( Prior ED visit )    Clinical Lab Test(s): Ordered and Reviewed    Diagnostic Tests (Radiology, EKG): Ordered and Reviewed    Independent Visualization (ED US, Wet Prep, Other): Yes - Documented in ED Provider Note    Discussed patient with NON-ED Provider: OB       ED Disposition   ED Disposition: Discharge      ED Clinical Impression   ED Clinical Impression:   Vaginal bleeding  Generalized abdominal pain  IUD (intrauterine device) in place      ED Patient Status   Patient Status:   Good        ED Medical Evaluation Initiated   Medical Evaluation Initiated:   Yes, filed at 02/15/17 1621  by Joneen RoachFishe, Jennifer, MD             Cheyenne Sidlesoycik, Amanda C, MD  Resident  02/15/17 1911       Joneen RoachFishe, Jennifer, MD  02/15/17 1920

## 2017-02-15 NOTE — ED Notes
Pt discharged to home. Pt given verbal and written d/c instructions. Pt verbalized understanding and denied having any further questions at this time. Pt ambulated out of ED with all pt belongings.

## 2017-02-15 NOTE — ED Provider Notes
History     Chief Complaint   Patient presents with   ? Vaginal Bleeding       HPI Comments: Cheyenne Rhodes is a 21 year old female presenting with abdominal pain and bleeding for the past day. Patient previosuly seen on 3/9 for simlar complaint was referred for OB follow up for IUD removal. Patient did not follow up with OB or schedule an appoitment. She reports that she had bleed through 10 maxi pads. She is sexually active and uses condems consistantly.    Patient is a 21 y.o. female presenting with abdominal pain. The history is provided by the patient. No language interpreter was used.   Abdominal Pain   Pain location:  RLQ and LLQ  Pain quality: sharp and stabbing    Pain quality: not cramping    Pain radiates to:  Does not radiate  Pain severity:  Moderate  Onset quality:  Sudden  Duration:  1 day  Timing:  Intermittent  Progression:  Worsening  Chronicity:  Recurrent  Relieved by:  None tried  Worsened by:  Palpation and movement  Ineffective treatments:  None tried  Associated symptoms: nausea and vaginal bleeding    Associated symptoms: no chest pain, no chills, no cough, no diarrhea, no dysuria, no fatigue, no fever, no shortness of breath, no sore throat and no vaginal discharge        Allergies   Allergen Reactions   ? Chocolate Angioedema   ? Risperidone Other (See Comments)     "I dont really know, I just know I'm allergic"       Patient's Medications   New Prescriptions    No medications on file   Previous Medications    LITHIUM 300 MG PO CAPSULE    Take 1 capsule by mouth 2 times daily (with meals). Reasons: Manic-Depression    OLANZAPINE (ZYPREXA) 10 MG PO TABLET    Take 1 tablet by mouth nightly at bedtime. Reasons: Manic-Depression    SERTRALINE (ZOLOFT) 50 MG PO TABLET    Take 1 tablet by mouth daily. Reasons: Depression   Modified Medications    No medications on file   Discontinued Medications    No medications on file       Past Medical History:   Diagnosis Date   ? Bipolar 1 disorder

## 2017-02-15 NOTE — ED Provider Notes
Independent Visualization (ED US, Wet Prep, Other): {SH ED Lamonte SakaiJX MDM NO YES [id number]}    Discussed patient with NON-ED Provider: {SH ED Lamonte SakaiJX MDM - ANOTHER PROVIDER:[id number]}      ED Disposition   ED Disposition: No ED Disposition Set      ED Clinical Impression   ED Clinical Impression:   No Clinical Impression Set      ED Patient Status   Patient Status:   [phone]}        ED Medical Evaluation Initiated   Medical Evaluation Initiated:   Yes, filed at 02/15/17 1621  by Joneen RoachFishe, Jennifer, MD

## 2017-02-15 NOTE — ED Provider Notes
Independent Visualization (ED US, Wet Prep, Other): Yes - Documented in ED Provider Note    Discussed patient with NON-ED Provider: OB       ED Disposition   ED Disposition: No ED Disposition Set      ED Clinical Impression   ED Clinical Impression:   No Clinical Impression Set      ED Patient Status   Patient Status:   Good        ED Medical Evaluation Initiated   Medical Evaluation Initiated:   Yes, filed at 02/15/17 1621  by Joneen RoachFishe, Jennifer, MD

## 2017-02-15 NOTE — ED Provider Notes
?   Bipolar I disorder with depression 02/05/2017   ? Candidal vulvovaginitis 02/08/2017   ? Depression    ? Non-adherence to medical treatment 02/05/2017   ? Personal history of noncompliance with medical treatment, presenting hazards to health        History reviewed. No pertinent surgical history.    History reviewed. No pertinent family history.    Social History     Social History   ? Marital status: Single     Spouse name: N/A   ? Number of children: N/A   ? Years of education: N/A     Social History Main Topics   ? Smoking status: Never Smoker   ? Smokeless tobacco: None   ? Alcohol use No   ? Drug use: No   ? Sexual activity: Not Asked     Other Topics Concern   ? None     Social History Narrative       Review of Systems   Constitutional: Negative for fever, chills and fatigue.   HENT: Negative for nasal congestion and sore throat.    Eyes: Negative for redness and itching.   Respiratory: Negative for cough and shortness of breath.    Cardiovascular: Negative for chest pain.   Gastrointestinal: Positive for nausea and abdominal pain. Negative for diarrhea.   Genitourinary: Positive for vaginal bleeding. Negative for dysuria and vaginal discharge.   Skin: Negative for rash.   Neurological: Negative for dizziness and headaches.       Physical Exam     ED Triage Vitals   BP 02/15/17 1609 131/79   Pulse 02/15/17 1609 85   Resp 02/15/17 1609 16   Temp 02/15/17 1609 36.9 ?C (98.5 ?F)   Temp src 02/15/17 1609 Oral   Height 02/15/17 1609 1.702 m   Weight 02/15/17 1609 69.4 kg   SpO2 02/15/17 1609 100 %   BMI (Calculated) 02/15/17 1609 24.01             Physical Exam   Constitutional: She appears well-developed and well-nourished.   HENT:   Head: Normocephalic and atraumatic.   Mouth/Throat: Oropharynx is clear and moist.   Eyes: Conjunctivae are normal. Pupils are equal, round, and reactive to light.   Neck: Neck supple.   Cardiovascular: Normal rate and regular rhythm.

## 2017-02-15 NOTE — ED Provider Notes
FINDINGS: UTERUS: The uterus is anteverted measuring 6.5 x 3.5 x 4.2  cm.  It is homogeneous in echogenicity.  No focal lesions are seen. ENDOMETRIUM: Intrauterine device noted within the expected location. CERVIX: The cervix is unremarkable. RIGHT OVARY: The right ovary measures 1.3 x 2.5 x 1.4  cm with small follicles. Arterial and venous waveforms are noted on Doppler exam. LEFT OVARY: The left ovary measures 1.6 x 3.0 x 1.9 cm with small follicles. Arterial and venous waveforms are noted on Doppler exam. FREE FLUID: No pelvic free fluid seen.     Intrauterine device noted within the endometrium, otherwise unremarkable pelvic ultrasound. Normal ovarian perfusion. I personally reviewed the images and the residents findings and agree with the above. Read By Beverly Milch- Christopher Klassen M.D.  Electronically Verified By Beverly Milch- Christopher Klassen M.D.  Released Date Time - 02/15/2017 7:02 PM  Resident - Lia HoppingJeremiah Libby       EKG (Read by ED Provider):  not applicable        ED Course & Re-Evaluation     ED Course     Patient seen and examined. OB consulted for appointment for IUD removal. Will perform pelvic exam   Dellie CatholicColleen Timmons, DO 4:45 PM 02/15/2017    Spoke with OB who recommede pelvic US before follow up in clinic   [place] Tim[place], DO 4:48 PM 02/15/2017    Slides examined, no evidence of trichamonas. Pelvic U/S ordered.   [name], DO 5:07 PM 02/15/2017    U/A within normal limits. Urine pregnncy negative will order Motrin for pain.   [name], DO 5:36 PM 02/15/2017    Patient in U/S   [name], DO 5:44 PM 02/15/2017      US normal, with IUD noted to be in appropriate position. Patient states she does not have phone, but gave registration her aunt's phone, which patient approved. Spoke with OB who states will call patient tomorrow (on aunt's phone) for follow up appointment. Return precautions include worsening abdominal pain, increased bleeding, or dizziness. Patient

## 2017-02-15 NOTE — ED Provider Notes
transvaginal pelvic ultrasound was performed for better evaluation. FINDINGS: UTERUS: The uterus is anteverted measuring 6.5 x 3.5 x 4.2  cm.  It is homogeneous in echogenicity.  No focal lesions are seen. ENDOMETRIUM: Intrauterine device noted within the expected location. CERVIX: The cervix is unremarkable. RIGHT OVARY: The right ovary measures 1.3 x 2.5 x 1.4  cm with small follicles. Arterial and venous waveforms are noted on Doppler exam. LEFT OVARY: The left ovary measures 1.6 x 3.0 x 1.9 cm with small follicles. Arterial and venous waveforms are noted on Doppler exam. FREE FLUID: No pelvic free fluid seen.     Intrauterine device noted within the endometrium, otherwise unremarkable pelvic ultrasound. Normal ovarian perfusion. I personally reviewed the images and the residents findings and agree with the above. Read By Beverly Milch- Christopher Klassen M.D.  Electronically Verified By Beverly Milch- Christopher Klassen M.D.  Released Date Time - 02/15/2017 7:02 PM  Resident - Lia HoppingJeremiah Libby       EKG (Read by ED Provider):  not applicable        ED Course & Re-Evaluation     ED Course     Patient seen and examined. OB consulted for appointment for IUD removal. Will perform pelvic exam   Dellie CatholicColleen Timmons, DO 4:45 PM 02/15/2017    Spoke with OB who recommeds pelvic US before follow up in clinic   [place] Tim[place], DO 4:48 PM 02/15/2017    Slides examined, no evidence of trichamonas. Pelvic U/S ordered.   [name], DO 5:07 PM 02/15/2017    U/A within normal limits. Urine pregnancy negative will order Motrin for pain.   [name], DO 5:36 PM 02/15/2017    Patient in U/S   [name], DO 5:44 PM 02/15/2017      US normal, with IUD noted to be in appropriate position, patients abdominal pain improved. Patient states she does not have phone, but gave registration her aunt's phone, which patient approved. Spoke with OB who states will call patient tomorrow (on aunt's phone) for follow up

## 2017-02-15 NOTE — ED Provider Notes
Pulmonary/Chest: Effort normal and breath sounds normal.   Abdominal: Soft. Bowel sounds are normal. She exhibits no distension. There is tenderness. There is guarding.   Tenderness present in RLQ and LLQ   Genitourinary: Vagina normal. No vaginal discharge found.   Genitourinary Comments: Exam chaperoned:   Vaginal bleeding, no ovarian TTP, no cervical motion tenderness, IUD in place with string visualization. No discharge.   Musculoskeletal: She exhibits no edema or tenderness.   Neurological: She is alert and oriented to person, place, and time. No cranial nerve deficit.   Skin: Skin is warm and dry. No rash noted.   Nursing note and vitals reviewed.      Differential DDx: PID, ovarian torsion, IUD pain    Is this an Emergent Medical Condition? Yes - Severe Pain/Acute Onset of Symptons  409.901 FS  641.19 FS  627.732 (16) FS    ED Workup   Procedures    Labs:  -   POCT URINALYSIS AUTO W/O MICROSCOPY - Abnormal        Result Value Ref Range    Color -Ur Yellow      Clarity, UA Hazy      Spec Grav 1.030  1.003 - 1.030    pH 6.0 (*) 7.4 - 7.4    Urobilinogen -Ur 1.0  <=2.0 E.U./dL    Nitrite -Ur Negative  Negative    Protein-UA Trace (*) Negative mg/dL    Glucose -Ur Negative  Negative mg/dL    Blood, UA Large (*) Negative    WBC, UA Negative  Negative    Bilirubin -Ur Negative  Negative    Ketones -UR Trace (*) Negative   POCT URINE PREGNANCY - Normal    Preg Test, Urine (POC) Negative  Negative   POCT URINE PREGNANCY   POCT URINALYSIS AUTO W/O MICROSCOPY         Imaging (Read by ED Provider):  Per Radiology: [place] Pelvic Transabdominal & Transvaginal W Doppler    Result Date: 02/15/2017  PROCEDURE:  US PELVIC TRANSABDOMINAL & TRANSVAGINAL W DOPPLER ORDERING HISTORY:  Pain ADDITIONAL HISTORY: None COMPARISON: None TECHNIQUE: Transabdominal pelvic ultrasound was initially performed with limited visualization of the endometrium and/or adnexal structures.  Therefore,

## 2017-02-22 NOTE — H&P
Neurological: Not asked.  Cardiovascular: Not asked.  Respiratory: Not asked.  GI: Not asked  Musculoskeletal: Not asked  Remainder of the pertinent ROS reviewed with Patient:  none    BMI: Body mass index is 24.12 kg/(m^2).     Allergies:   Allergies   Allergen Reactions   ? Chocolate Angioedema   ? Risperidone Other (See Comments)     "I dont really know, I just know I'm allergic"       No prescriptions prior to admission.           Drug and Alcohol History:   The patient was not asked about alcohol use, drinking (not obtained) per week.     The patient was not asked about any current or past use of illicit drugs.     The patient was not asked about any history of legal problems related to drugs or alcohol and was not asked about any history of drug or alcohol detox or rehab treatment.   Previous treatment: none  The patient was not asked about any current or past use of tobacco.      Current Outpatient substance treatment: No         Family  History:   Psychiatric: Not asked  Suicide: Not asked  Substance Abuse: Not asked  Physical Problems: Not asked    Mental Status Examination:    Mental Status:   Cheyenne Rhodes was drowsy and asleep and oriented to person, place. She appeared stated age her stated age of [age] y.o.. She appears well-developed, well-nourished with fair grooming and hygiene. She presented as calm and her eye contact was generally poor throughout the interview. Psychomotor activity was normal and she displayed no abnormal or involuntary movements. Her gait was within normal limits. Her speech was soft. Her mood was "irritable". Her affect was stable. Thought process was linear. She endorses no hallucinations or delusions and denies suicidal or homicidal ideation. Fund of knowledge was adequate. Focus and concentration are inadequate based on ability to remain engaged, tracking and following the flow of conversation. Immediate and recent memory were

## 2017-02-22 NOTE — H&P
stated by the scribe and made corrections and edits as appropriate.  I have personally provided the services documented by the scribe.      Raul Soto-Acosta, MD

## 2017-02-22 NOTE — H&P
-  To target the patient's  symptoms, we will start Lithium. Risks and benefits of medication were discussed with the patient.     - Ativan 1 mg PO/IM q4hours PRN agitation  Haldol 2 mg PO/IM q4hours PRN agitation  - benztropine 1mg  q6h PO/IM PRN acute dystonia or EPS  - Psychotropic medication reconciliation done. Reviewed with patient the indications, risks, benefits, alternatives, and potential consequences of no treatment with psychotropic medications. Patient was given ample time to ask any questions and was in agreement with the treatment plan.     - Routine laboratory workup ordered and will be reviewed.      - Medical/substance/nutrition Consult will be placed for Dr. Betti Cruzeddy and his team.     -The previous medical record will be reviewed and collateral information   obtained from family, group home and case management if it becomes available.     -The patient will be encouraged to participate in the therapeutic milieu in order to evaluate behavioral and treatment response.     - We are going to refer the patient for individual counseling with [facility].     - Will continue to monitor for suicidal or homicidal thoughts as well as monitor for evolution of psychiatric process including development of psychosis.     - We will initiate safety planning and social services regarding aftercare and disposition options. Firearms and other dangerous weapons are encouraged to be removed from the living environment.     - Discharge to occur pending psychiatric stabilization of symptoms.     Monitor for increasing depression  Monitor vital signs for clinical correlation  Maintain compliance  Check lab studies  Medicine consultation    Sherlynn Carbonaul Soto-Acosta, MD    Scribe Attestation: I, Earley AbideEdward Daly, have acted as a Neurosurgeonscribe for No att. providers found 11:53 AM 02/05/2017     Physician Attestation: I have reviewed and confirmed the information

## 2017-02-22 NOTE — H&P
[facility]  Inpatient Psychiatry  [place], [place]      HISTORY & PHYSICAL/INITIAL PSYCHIATRIC EVALUATION      MRN: [id number]  Patient Name: Chelsea Primusmaris Ruth Koller       D.O.B.: 01/17/1996  Date of admission: 02/04/2017  Date of Service: 02/05/2017     Identifying Data: Chelsea Primusmaris Ruth Pilant is a 21 y.o. y.o. female who is Single [1] and is currentlyNot Employed [3]. The patient is here under a Engineer, productionBaker Act dated 02/04/2017 that says the patient "said she was depressed, having a bad day, and wanted to harm herself". Primary care physician is Patient, None Per and her outpatient psychiatrist is none.    Presenting Complaint: When asked if the patient feels she should be here, the patient responded by nodding "yes".      History of Present Illness:   Brynlee Erline HauRuth Wingert is a 21 y.o. female who is here under a Engineer, productionBaker Act dated 02/04/2017 that says the patient "said she was depressed, having a bad day, and wanted to harm herself". During the patient interview she was sleeping and kept her eyes clothes the majority of the time. She did not respond to the questions asked and when she did respond she mumbled inaudible answers to most of the questions. Ms. Aline AugustHolmes endorsed that she was experiencing SI "over the past month" and that she has "family and friends to help" with her condition. Ms. Aline AugustHolmes currently lives alone and believes she should receive treatment. The patient did not respond to questions concerning current medications, HI, or pain.      sleep: yes  appetite changes: no  weight changes: no  energy/anergy: no  interest/pleasure/anhedonia: no  somatic symptoms: no  libido: Not asked.  anxiety/panic: no  guilty/hopeless: no  S.I.B.s/risky behavior: no  any drugs: no  alcohol: no     Depression:  Endorses depressed mood, decreased sleep, decreased concentration, decreased interests, low energy, change of appetite, feelings of guilt, helplessness, or hopelessness.

## 2017-02-22 NOTE — H&P
intact based on ability to recall personal information and recent events.  Her insight was poor and her judgment was poor.      There were not abnormal involuntary movement present. Her gait was steady.          Interpretive Summary:   Cheyenne Rhodes is a 21 y.o.female who is here under a Engineer, productionBaker Act dated 02/04/2017 that says the patient "said she was depressed, having a bad day, and wanted to harm herself". During the patient interview she was sleeping and kept her eyes clothes the majority of the time. She did not respond to the questions asked and when she did respond she mumbled inaudible answers to most of the questions.  At this moment in time the patient is in need of this level of care due to imminent danger to self or others. The preceding assessment is based on the available information at the time of this evaluation.      Justification for admission:  suicide attempt/gesture/ideation.    Admission Diagnoses:  DSM-V Format  Present on Admission:  ? Bipolar I disorder with depression  ? (Principal) Bipolar I disorder with mixed features  ? Non-adherence to medical treatment  ? Candidal vulvovaginitis      Prognosis: poor    Estimated Length of Stay:  4-6 days    Initial Treatment Plan:  - At this moment in time, we are going to keep Cheyenne Rhodes for further treatment and stabilization.   Cheyenne Rhodes has been deemed     Competent to provide express and informed consent, as defined above, for voluntary admission to this facility and is competent to provide express and informed consent for treatment. She has the consistent capacity to make well reasoned, willful and knowing decisions concerning Her medical or mental health treatment. The person fully and consistently understands the purpose of the admission for examination/placement and is fully capable of personally exercising all rights assured [place]  Patient will not need a health care proxy.

## 2017-02-22 NOTE — H&P
Cardiovascular: Not asked.  Respiratory: Not asked.  GI: Not asked  Musculoskeletal: Not asked  Remainder of the pertinent ROS reviewed with Patient:  none    BMI: Body mass index is 27.1 kg/(m^2).     Allergies:   Allergies   Allergen Reactions   ? Chocolate Angioedema   ? Risperidone Other (See Comments)     "I dont really know, I just know I'm allergic"       Prescriptions Prior to Admission   ? Order #: [id number] Class: Historical Med   ? Order #: [id number] Class: Historical Med   ? Order #: [id number] Class: Historical Med   ? Order #: [id number] Class: Historical Med           Drug and Alcohol History:   The patient was not asked about alcohol use, drinking (not obtained) per week.     The patient was not asked about any current or past use of illicit drugs.     The patient was not asked about any history of legal problems related to drugs or alcohol and was not asked about any history of drug or alcohol detox or rehab treatment.   Previous treatment: none  The patient was not asked about any current or past use of tobacco.      Current Outpatient substance treatment: No         Family  History:   Psychiatric: Not asked  Suicide: Not asked  Substance Abuse: Not asked  Physical Problems: Not asked    Mental Status Examination:    Mental Status:   Cheyenne Rhodes was drowsy and asleep and oriented to person, place. She appeared stated age her stated age of 21 y.o.. She appears well-developed, well-nourished with fair grooming and hygiene. She presented as calm and her eye contact was generally poor throughout the interview. Psychomotor activity was normal and she displayed no abnormal or involuntary movements. Her gait was within normal limits. Her speech was soft. Her mood was "irritable". Her affect was stable. Thought process was linear. She endorses no hallucinations or delusions and denies suicidal or homicidal ideation. Fund of knowledge was adequate. Focus and

## 2017-02-22 NOTE — Psych Progress Note
Or      LORazepam 1 mg Oral Q4H PRN   Or      LORazepam 1 mg Intramuscular Q4H PRN        Vital Signs: Last Filed Vitals Signs: 24 Hour Range   BP: 122/67 (03/05 1910) BP: (111-122)/(67-75)    Temp: 36.9 ?C (98.5 ?F) (03/05 1910) Temp:  [36.9 ?C (98.5 ?F)]    Pulse: 70 (03/05 1910) Pulse:  [67-70]    Resp: 16 (03/05 1910) Resp:  [16-17]          Respiratory Device: Room Air / (None)        Score: FLACC : 0      No results found for this visit on 02/04/17 (from the past 24 hour(s)).    Body mass index is 24.12 kg/(m^2).      Diagnoses:    Patient Active Problem List   Diagnosis   ? Bipolar I disorder with depression   ? Bipolar I disorder with mixed features   ? Non-adherence to medical treatment         Plan:  Patient is still fragile as evidenced by the assessment of mental status and support system. Patient's depression, is uncontrolled and the patient lacks adequate judgement to maintain physical safety and inadequate problem-solving skills, posing risk of immediate decompensation if discharged.     debilitating depression,  and I certify that this patient continues to need, on a daily basis, active treatment furnished directly by or requiring the supervision of inpatient psychiatric facility personnel.  We will continue same medications and treatment plan      Scribe Attestation: I, Earley Abidedward Daly, have acted as a Neurosurgeonscribe for Dr. Denny PeonSoto-Acosta 7:11 AM 02/07/2017     Physician Attestation: I have reviewed and confirmed the information stated by the scribe and made corrections and edits as appropriate.  I have personally provided the services documented by the scribe.      Sherlynn Carbonaul Soto-Acosta, MD

## 2017-02-22 NOTE — Discharge Summary
showing improved insight into condition and the patient was monitored without incident. Cheyenne Rhodes denies, in a very convincing manner, suicidal or homicidal ideas. Pt reached maximum therapeutic in-patient benefit and the decision was made for discharge.     Initial physical examination was done by Dr. Reece Levy or his associate    Consults:   Consults   Procedures   ? Inpatient consult to Int Med for Psych   ? Inpatient consult to Internal Medicine for Psychiatric Patients ([facility] Only)   ? IP consult to Int Med for Psychiatric Patients ([facility] Only)       Significant Diagnostic Studies:   Labs include:   Metabolic Lab Profile:  Lab Results   Component Value Date/Time    NA 141 02/05/2017 03:37 AM    K 3.8 02/05/2017 03:37 AM    CL 101 02/05/2017 03:37 AM    CO2 23 02/05/2017 03:37 AM    BUN 9 02/05/2017 03:37 AM    CREATININE 0.67 02/05/2017 03:37 AM    GLU 84 02/05/2017 03:37 AM    CALCIUM 8.6 02/05/2017 03:37 AM    TPROT 5.8 (L) 02/05/2017 03:37 AM    ALB 3.5 (L) 02/05/2017 03:37 AM    AST 13 (L) 02/05/2017 03:37 AM    ALT 6 (L) 02/05/2017 03:37 AM    EGFR >59 02/05/2017 03:37 AM    TBILI 0.2 02/05/2017 03:37 AM    ALKPHOS 56 02/05/2017 03:37 AM       CBC (with or without Differential):   Lab Results   Component Value Date    WBC 4.41 (L) 02/05/2017    HGB 10.5 (L) 02/05/2017    HCT 34.6 (L) 02/05/2017    MCV 80.3 (L) 02/05/2017    MCH 24.4 (L) 02/05/2017    MCHC 30.3 (L) 02/05/2017    RDW 16.8 (H) 02/05/2017    PLATCOUNT 199 02/05/2017    MPV 11.4 02/05/2017    NEUTROPCT 39 02/05/2017    LYMPHPCT 54 02/05/2017    MONOPCT 5 02/05/2017    EOSPCT 2 02/05/2017    BASOPCT 0 02/05/2017       Fasting Lipid Profile:  No results found for: CHOL, HDL, LDL, TRIG, CHOLHDL    Hemoglobin A1c:  No results found for: HGBA1C    Height: Height: 170.2 cm (5' 7")  Weight: Weight: 69.9 kg (154 lb)  BMI: BMI (Calculated): 27.15   PAIN :  0 /10    BP Readings from Last 3 Encounters:   02/15/17 128/76   02/10/17 118/73

## 2017-02-22 NOTE — Psych Progress Note
Or      LORazepam 1 mg Oral Q4H PRN   Or      LORazepam 1 mg Intramuscular Q4H PRN        Vital Signs: Last Filed Vitals Signs: 24 Hour Range   BP: 122/67 (03/05 1910) BP: (111-122)/(67-75)    Temp: 36.9 ?C (98.5 ?F) (03/05 1910) Temp:  [36.9 ?C (98.5 ?F)]    Pulse: 70 (03/05 1910) Pulse:  [67-70]    Resp: 16 (03/05 1910) Resp:  [16-17]          Respiratory Device: Room Air / (None)        Score: FLACC : 0      No results found for this visit on 02/04/17 (from the past 24 hour(s)).    Body mass index is 24.12 kg/(m^2).      Diagnoses:    Patient Active Problem List   Diagnosis   ? Bipolar I disorder with depression   ? Bipolar I disorder with mixed features   ? Non-adherence to medical treatment         Plan:  Patient is still fragile as evidenced by the assessment of mental status and support system. Patient's ***psychosis, depression, is uncontrolled and the patient lacks adequate judgement to maintain physical safety and inadequate problem-solving skills, posing risk of immediate decompensation if discharged.     { :34992::"***","I certify that this patient continues to need, on a daily basis, active treatment furnished directly by or requiring the supervision of inpatient psychiatric facility personnel."}      Scribe Attestation: I, Earley AbideEdward Daly, have acted as a Neurosurgeonscribe for Dr. Denny PeonSoto-Acosta 7:11 AM 02/07/2017     Physician Attestation: Marland Kitchen***

## 2017-02-22 NOTE — Discharge Summary
Weight: Weight: 69.9 kg (154 lb)  BMI: BMI (Calculated): 27.15   PAIN :  0 /10    BP Readings from Last 3 Encounters:   02/09/17 107/54       Other Relevant Labs Include:    Lab Results   Component Value Date    TSH 1.890 02/05/2017     Restraints use: None    The patient during this hospitalization received a combination of individual, group, and milieu therapy, as well as psychopharmacological treatment. Response to treatment has been excellent.      Mental Status Examination upon Discharge:      Taniesha Erline HauRuth Senna was alert and oriented x3 .She appeared her stated age of 21 y.o. She had fair grooming and hygiene. She did maintain good eye contact throughout the interview. She presented as calm and cooperative. Psychomotor activity was normal. Speech was soft and regular rate. Mood was content. Affect was congruent with mood. Thought process was linear and logical. Denies suicidal ideation, homicidal ideation, delusions and ruminations. Fund of knowledge was adequate. Focus and concentration are adequate based on the ability to hold a conversation. Immediate and recent memory were intact. The patient did not exhibit abstract reasoning based on a proverb. Her insight and judgment were good.    There were not abnormal involuntary movement present. Her gait was not assessed.    At this moment in time Lacoya Erline HauRuth Fahs is not exhibiting any behaviour suggestive of any acute danger to self or others, nor an acute psychotic process. The patient is deemed to be stable enough to be treated on an outpatient basis. The precedent assessment is based on the available information at the time of this discharge.    Final Diagnoses:  Present on Admission:  ? Bipolar I disorder with depression  ? Bipolar I disorder with mixed features  ? Non-adherence to medical treatment  ? Candidal vulvovaginitis      Prognosis: good     Medications Upon Discharge:     Discharge Medications      Notice

## 2017-02-22 NOTE — Discharge Summary
You have not been prescribed any medications.          No discharge procedures on file.     Recommendations:  Cheyenne Rhodes is going to be discharged to *** and she is going to be referred to *** for outpatient psychiatric followup. The patient was given information and about the [facility] and she had been made aware of their availability of the emergency evaluation services 24 hours a day, 7 days a week , as well as 911. The patient has been advised to follow up with the Primary Care Physician for any physical concerns.{Lifestyle recommendations:31725}. Total sobriety was also advised.        Tyrone SchimkeSusan Rhodes    Scribe Attestation: I, Tyrone SchimkeSusan Rhodes, have acted as a Neurosurgeonscribe for Dr. Denny PeonSoto-Acosta 11:43 AM 02/09/2017     Physician Attestation: Marland Kitchen***

## 2017-02-22 NOTE — H&P
Mania:  Denies recent manic symptoms or negative consequences of past mania, including increased goal-directed or high risk activity, impulsivity, grandiosity, distractability, irritability, decreased need for sleep, elevated mood, increased rate of speech, and racing thoughts.    Anxiety:  Denies significant anxiety, including excess worrying, restlessness or feeling edgy, increased muscle tension, decreased sleep, and decreased concentration related to anxiety.     PTSD:  Denies major stress/trauma and symptoms related to trauma, including recurrent disturbing dreams, avoidance of troubling memories, amnesia for key events of trauma, unwanted images/flashbacks, markedly diminished interest, emotional numbing, hypervigilance, and exaggerated startle response.     OCD:   Denies obsessive thoughts and compulsions, including repetitive washing/cleaning, counting/checking, or organizing/praying behaviors and intrusive or persistent thoughts that are recognized as excessive or irrational.    Panic Disorder: Denies symptoms of panic attacks, including acute onset of fear and anxiety accompanied by trembling, palpitations, nausea/chills, choking/chest pain, sweating, the fear of dying or going crazy, anticipatory anxiety, avoidance, and agoraphobia.    Psychosis:  Denies psychotic symptoms including auditory hallucinations, visual hallucinations, tactile hallucinations, illusions, and delusional beliefs.    Memory:  Denies short or long term memory problems, word-finding difficulties, problems remembering friends/family.      ADHD: Patient denied symptoms of inattention and hyperactivity.     Eating Patterns:  Patient denies purposeful restricting of energy intake relative to requirements, and binging/purging to obtain desired appearance which results in clinically significant distress or impairment in functioning.       Past Psychiatric History:       The patient?s previous psychiatric diagnoses include  depression

## 2017-02-22 NOTE — Psych Progress Note
Temp: 36.8 ?C (98.2 ?F) (03/04 1928) Temp:  [36.8 ?C (98.2 ?F)-36.8 ?C (98.3 ?F)]    Pulse: 64 (03/04 1928) Pulse:  [64-106]    Resp: 16 (03/04 1928) Resp:  [16-18]          Respiratory Device: Room Air / (None)        Score: FLACC : 0      No results found for this visit on 02/04/17 (from the past 24 hour(s)).    Body mass index is 27.1 kg/(m^2).      Diagnoses:    Patient Active Problem List   Diagnosis   ? Bipolar I disorder with depression   ? Bipolar I disorder with mixed features   ? Non-adherence to medical treatment         Plan:  Patient is still fragile as evidenced by the assessment of mental status and support system. Patient's depression, is uncontrolled and the patient lacks adequate judgement to maintain physical safety and inadequate problem-solving skills, posing risk of immediate decompensation if discharged.     debilitating depression,  and I certify that this patient continues to need, on a daily basis, active treatment furnished directly by or requiring the supervision of inpatient psychiatric facility personnel.  We will continue same medications and treatment plan.      Scribe Attestation: I, Earley AbideEdward Daly, have acted as a Neurosurgeonscribe for Dr. Denny PeonSoto-Acosta 7:31 AM 02/06/2017     Physician Attestation: I have reviewed and confirmed the information stated by the scribe and made corrections and edits as appropriate.  I have personally provided the services documented by the scribe.      Sherlynn Carbonaul Soto-Acosta, MD

## 2017-02-22 NOTE — Psych Progress Note
Temp: 36.8 ?C (98.2 ?F) (03/04 1928) Temp:  [36.8 ?C (98.2 ?F)-36.8 ?C (98.3 ?F)]    Pulse: 64 (03/04 1928) Pulse:  [64-106]    Resp: 16 (03/04 1928) Resp:  [16-18]          Respiratory Device: Room Air / (None)        Score: FLACC : 0      No results found for this visit on 02/04/17 (from the past 24 hour(s)).    Body mass index is 27.1 kg/(m^2).      Diagnoses:    Patient Active Problem List   Diagnosis   ? Bipolar I disorder with depression   ? Bipolar I disorder with mixed features   ? Non-adherence to medical treatment         Plan:  Patient is still fragile as evidenced by the assessment of mental status and support system. Patient's ***psychosis, depression, is uncontrolled and the patient lacks adequate judgement to maintain physical safety and inadequate problem-solving skills, posing risk of immediate decompensation if discharged.     { :34992::"***","I certify that this patient continues to need, on a daily basis, active treatment furnished directly by or requiring the supervision of inpatient psychiatric facility personnel."}      Scribe Attestation: I, Earley AbideEdward Daly, have acted as a Neurosurgeonscribe for Dr. Denny PeonSoto-Acosta 7:31 AM 02/06/2017     Physician Attestation: Marland Kitchen***

## 2017-02-22 NOTE — Discharge Summary
Call your OUT patient doctor or therapist if you experience worsening of your symptoms.    SUICIDE Prevention Hotline is 1-800-273- TALK (8255)    Advise Total Sobriety, to call for help call 211 or [phone](904) [phone] (FIRST CALL)  Advise Smoking Cessation     WARFARIN (Coumadin) Education is NOT Required     The orders/medications listed here were reviewed, ordered and electronically signed by:   Sherlynn Carbonaul Soto-Acosta, MD          Recommendations:  Cheyenne Rhodes is going to be discharged to home and she is [facility] follow[facility]. The patient was given information and about the [facility] and she had been made aware of their availability of the emergency evaluation services 24 hours a day, 7 days a week , as well as 911. The patient has been advised to follow up with the Primary Care Physician for any physical concerns.Begin light exercise after consulting PCP. Total sobriety was also advised.        Cheyenne Rhodes    Scribe Attestation: I, Tyrone SchimkeSusan Chapman, have acted as a Neurosurgeonscribe for Dr. Denny PeonSoto-Acosta 11:43 AM 02/09/2017     Physician Attestation: I have reviewed and confirmed the information stated by the scribe and made corrections and edits as appropriate.  I have personally provided the services documented by the scribe.      Sherlynn Carbonaul Soto-Acosta, MD

## 2017-02-22 NOTE — Discharge Summary
-   Another medication with the same name was removed. Continue taking this medication, and follow the directions you see here.    300 mg, Oral, BID W/ Meals   Quantity:  60 capsule       OLANZapine 10 MG Tabs   Commonly known as:  ZYPREXA   What changed:  how much to take    10 mg, Oral, Nightly   Quantity:  30 tablet       sertraline 50 MG Tabs   Commonly known as:  ZOLOFT   What changed:    - medication strength  - how much to take  - additional instructions  - Another medication with the same name was removed. Continue taking this medication, and follow the directions you see here.    50 mg, Oral, daily   Quantity:  30 tablet         Stopped Meds          divalproex 125 MG Tbec   Commonly known as:  DEPAKOTE               Discharge Orders  For patients with heart or kidney problems: Measure weight daily   If I have heart or kidney problems, I will record my daily weight and call my physician to report a gain of 3 lbs or more in 5 days.       Activity as tolerated     Symptoms to monitor after discharge   For any questions or concerns and to report any of the following symptoms/problems: Worsening of current symptoms, Chest pain, Shortness of breath, Dizziness, Swelling in ankles or legs, Pain uncontrolled by medication, Drainage or foul odor from incision, Extensive bruising or discoloration, Nausea/vomiting, Elevated temperature. Notify your physician.     Smoking cessation   If you smoke, stop.  If you don't smoke, do not start.  Avoid second hand smoke.  For help call the toll-free QuitLine at 337-188-35101-(781)356-6663.  Follow up with Primary care Physician within seven days    Contact [employer] (www. nami.org)  [place] [facility] is  www.[place].nami.org   Phone [phone](904) [phone]    Call 911 if you have active thoughts of harming yourself or others, if you have made a suicide attempt or if you experience severe medical problems

## 2017-02-22 NOTE — Discharge Summary
[facility]  Inpatient Psychiatry  [place], [place]      DISCHARGE SUMMARY    MRN: [id number]  Patient Name: Cheyenne Rhodes    D.O.B; 11/11/1996  Date of Admission: 02/04/2017  Date of Discharge:  02/09/2017  Admitting Physician: Cheyenne LaymanPradeep Arora, MD   Discharge Physician: Cheyenne Carbonaul Soto-Acosta, MD      Admission Condition: critical    Discharged Condition: good     Cheyenne Rhodes was discharged in an improved condition.    Indication for Admission: suicide attempt/gesture/ideation.    Reason for Admission: Cheyenne Rhodes is a 21 y.o. female who is here under a Engineer, productionBaker Act dated 02/04/2017 that says the patient "said she was depressed, having a bad day, and wanted to harm herself."  During the patient interview, she was sleeping and kept her eyes closed the majority of the time. She did not respond to the questions asked.  When she did respond, she mumbled inaudible answers to most of the questions. Cheyenne Rhodes endorsed that she was experiencing SI "over the past month" and that she has "family and friends to help" with her condition. Cheyenne Rhodes currently lives alone and believes she should receive treatment. The patient did not respond to questions concerning current medications, HI, or pain.    Admission Diagnoses: Suicidal ideation [R45.851]  Depression, acute [F32.9]  Bipolar 1 disorder [F31.9]    No prescriptions prior to admission.       Inpatient Course of Stay:    Treatment Rendered: Medication Management  Social Service/DC Planning     Hospital Course:  Cheyenne Rhodes is a 21 y.o. female who [facility]. Please see H&P for further information about the patient's initial presentation. she was started on medications. Relevant labs were reviewed and are below. Pt tolerated all medication changes and showed adequate improvement. she began attending groups and interacted well with others in the ward milieu. Pt began

## 2017-02-22 NOTE — H&P
concentration are inadequate based on ability to remain engaged, tracking and following the flow of conversation. Immediate and recent memory were intact based on ability to recall personal information and recent events.  Her insight was poor and her judgment was poor.      There were not abnormal involuntary movement present. Her gait was steady.          Interpretive Summary:   Cheyenne Rhodes is a 21 y.o.female who is here under a Engineer, productionBaker Act dated 02/04/2017 that says the patient "said she was depressed, having a bad day, and wanted to harm herself". During the patient interview she was sleeping and kept her eyes clothes the majority of the time. She did not respond to the questions asked and when she did respond she mumbled inaudible answers to most of the questions.  At this moment in time the patient is in need of this level of care due to imminent danger to self or others. The preceding assessment is based on the available information at the time of this evaluation.      Justification for admission:  suicide attempt/gesture/ideation.    Admission Diagnoses:  DSM-V Format  Present on Admission:  ? Bipolar I disorder with depression  ? Bipolar I disorder with mixed features  ? Non-adherence to medical treatment      Prognosis: poor    Estimated Length of Stay:  4-6 days    Initial Treatment Plan:  - At this moment in time, we are going to keep Cheyenne Cheyenne Rhodes for further treatment and stabilization.   Cheyenne Rhodes has been deemed     {VS IP BAKER ACT COMPENENCE:[id number]}    If patient needs a health care proxy, proxy is :    __ Judicially appointed guardian authorized to consent to medical treatment   __ Person's spouse   __ Adult child of the person   __ Parent of the person   __ Adult relative of the person who has exhibited special care and concern for the person   __ Close friend of the person who has exhibited special care and concern for the person

## 2017-02-22 NOTE — Psych Progress Note
[facility]  [facility]        Admit Date: 02/04/2017     LOS: 2 days       MRN: [id number]   Patient : Cheyenne Rhodes   D.O.B :  02/07/1996    Date of Service: 02/07/2017     Subjective/objective:  Cheyenne Rhodes 21 y.o. female was seen today and stated that she is feeling "alright". The patient reported that the SI is "better" and denies HI. The patient stated that she "has not spoke with her family" and only answered a few intervieq questions. The patient decided to go back to sleep during the interview. Patient was dismissive. Cheyenne Rhodes's appetite has been stable. Patient stated that their energy level  has been stable. Patient stated that their sleep level has been stable.  Cheyenne Rhodes's speech was normal talkative; soft. Patient admits to suicidal ideation. The patient's attention was adequate, and their cognition was within functional limits.     Compliant with medication:yes  Behavioral disturbance: no  Emergency treatment order required: no  Seclusion/restraints required: no  Attending therapy groups: Not asked    Pain rating: Pain Rating: 2 /10.  Location: "Behind the right ear" for "2.5 days".  she reported that her pain has not been well-managed. Observed pain behaviors: verbalizations.     Current Medication List:  Scheduled:   ? celecoxib  100 mg Oral BID   ? fluconazole  150 mg Oral Once   ? lithium  300 mg Oral BID W/ Meals   ? metroNIDAZOLE  500 mg Oral Q12H SCH   ? neomycin-bacitracin-polymyxin (NEOSPORIN) ointment   Topical BID   ? OLANZapine  10 mg Oral Nightly   ? sertraline  50 mg Oral daily       PRN:   aluminum-magnesium-simethicone hydroxide 30 mL Oral Q4H PRN   benztropine 1 mg Oral Q6H PRN   Or      benztropine mesylate 1 mg Intramuscular Q6H PRN   diphenhydrAMINE 50 mg Oral Nightly PRN   haloperidol 2 mg Oral Q4H PRN   Or      haloperidol lactate 2 mg Intramuscular Q4H PRN   Or      haloperidol 2 mg Oral Q4H PRN   LORazepam 1 mg Oral Q4H PRN

## 2017-02-22 NOTE — Discharge Summary
02/09/17 107/54       Other Relevant Labs Include:    Lab Results   Component Value Date    TSH 1.890 02/05/2017     Restraints use: None    The patient during this hospitalization received a combination of individual, group, and milieu therapy, as well as psychopharmacological treatment. Response to treatment has been excellent.      Mental Status Examination upon Discharge:      Cheyenne Rhodes was alert and oriented x3 .She appeared her stated age of 21 y.o. She had fair grooming and hygiene. She did maintain good eye contact throughout the interview. She presented as calm and cooperative. Psychomotor activity was normal. Speech was soft and regular rate. Mood was content. Affect was congruent with mood. Thought process was linear and logical. Denies suicidal ideation, homicidal ideation, delusions and ruminations. Fund of knowledge was adequate. Focus and concentration are adequate based on the ability to hold a conversation. Immediate and recent memory were intact. The patient did not exhibit abstract reasoning based on a proverb. Her insight and judgment were good.    There were not abnormal involuntary movement present. Her gait was not assessed.    At this moment in time Cheyenne Rhodes is not exhibiting any behaviour suggestive of any acute danger to self or others, nor an acute psychotic process. The patient is deemed to be stable enough to be treated on an outpatient basis. The precedent assessment is based on the available information at the time of this discharge.    Final Diagnoses:  Present on Admission:  ? Bipolar I disorder with depression  ? (Resolved) Bipolar I disorder with mixed features  ? Non-adherence to medical treatment  ? Candidal vulvovaginitis      Prognosis: fair    Medications Upon Discharge:     Discharge Medications      Modified Prior to Admission Meds       Sig    lithium 300 MG Caps   What changed:    - when to take this  - additional instructions

## 2017-02-22 NOTE — H&P
The patient has had no previous psychiatric hospitalizations.      Prior psychiatric medications: The patient did not state her past medications.    Current psychiatric medication: Patient did not state her current medication.  Please see PTA list.      Suicide Risk Assessment:  The patient Denies a history of suicide attempts.  Prior aborted/interrupted suicide attempts: No  Prior intentional self-injury without suicide intent: No  Accessibility of suicide methods (ex firearms): No  Motivations for suicide (stressors): No  Protective factors: Yes, "family and friends".      Violence Assessment:  History of assault/violent behavior: No    Medical Records available :  Record Review: brief.    Psychosocial History:  Current living situation: Lives alone.  The patient was born in (not obtained[place] and raised in (not obtained[place] Her parents are (not obtained), and she has (not obtained) siblings. Cheyenne Rhodes has been married 0 times and has 0 kids. She worked as (not obtained) in the past, and currently and receives (not obtained) for finances. At this time She has no legal issues. Cheyenne Rhodes did not state she experienced abuse. She denies a history of being in the [employer].   Social supports: "family and friends"  Education: Not obtained during interview.    Spiritual/religious affiliation: not asked.    Medical History:   Head trauma: Not asked  Seizure history: Not asked  Hypertension: Not asked  Diabetes: Not asked  Coronary artery disease: Not asked  CVA: Not asked  LMP: Not asked  Past Medical History:   Diagnosis Date   ? Bipolar 1 disorder    ? Bipolar I disorder with depression 02/05/2017   ? Candidal vulvovaginitis 02/08/2017   ? Depression    ? Non-adherence to medical treatment 02/05/2017   ? Personal history of noncompliance with medical treatment, presenting hazards to health      History reviewed. No pertinent surgical history.    Pain:  Complaints of Pain: none    Physical and psychiatric ROS:

## 2017-02-22 NOTE — Psych Progress Note
[facility]   [facility]        Admit Date: 02/04/2017     LOS: 1 day       MRN: [id number]   Patient : Cheyenne Rhodes   D.O.B :  08/03/1996    Date of Service: 02/06/2017     Subjective/objective:  Cheyenne Rhodes 21 y.o. female was seen today and stated that he was "alright". However, for the remainder of the interview the patient chose to sleep and refused to answer any of our questions. Overall, the interview was very brief and non-informative. Patient was dismissive and poor eye contact. Cheyenne Rhodes appetite has been stable. Patient stated that their energy level  has been stable. Patient stated that their sleep level has been stable.  Cheyenne Rhodes speech was slow, patient did not engage in full conversation. Patient denies any psychotic process (including paranoia, hallucinations or delusions), SI or HI. The patient's attention was inadequate, and their cognition was within functional limits.     Compliant with medication:Not asked  Behavioral disturbance: no  Emergency treatment order required: no  Seclusion/restraints required: no  Attending therapy groups: Not asked    Pain rating: Pain Rating: 0 /10.      Current Medication List:  Scheduled:   ? lithium  300 mg Oral BID W/ Meals   ? OLANZapine  10 mg Oral Nightly   ? sertraline  50 mg Oral daily       PRN:   acetaminophen 650 mg Oral Q6H PRN   aluminum-magnesium-simethicone hydroxide 30 mL Oral Q4H PRN   benztropine 1 mg Oral Q6H PRN   Or      benztropine mesylate 1 mg Intramuscular Q6H PRN   diphenhydrAMINE 50 mg Oral Nightly PRN   haloperidol 2 mg Oral Q4H PRN   Or      haloperidol lactate 2 mg Intramuscular Q4H PRN   Or      haloperidol 2 mg Oral Q4H PRN   LORazepam 1 mg Oral Q4H PRN   Or      LORazepam 1 mg Oral Q4H PRN   Or      LORazepam 1 mg Intramuscular Q4H PRN        Vital Signs: Last Filed Vitals Signs: 24 Hour Range   BP: 122/74 (03/04 1928) BP: (110-122)/(68-74)

## 2017-02-22 NOTE — Psych Progress Note
[facility]        Admit Date: 02/04/2017     LOS: 3 days       MRN: [id number]   Patient : Cheyenne Rhodes   D.O.B :  10/04/1996    Date of Service: 02/08/2017     Subjective/objective:  Cheyenne Rhodes 21 y.o. female was seen today and denied SI, HI,paranoia, medication side-efffects, and mood swings. The patient reported that she does not have a psychiatrist outside the Psychiatric Unit. Patient was cooperative and appropriate with good eye contact. Cheyenne Rhodes's appetite has been stable. Patient stated that their energy level  has been stable. Patient stated that their sleep level has been stable.  Cheyenne Rhodes's speech was normal talkative; soft. Patient denies any psychotic process (including paranoia, hallucinations or delusions), SI or HI. The patient's attention was adequate, and their cognition was within functional limits.     Compliant with medication:yes  Behavioral disturbance: no  Emergency treatment order required: no  Seclusion/restraints required: no  Attending therapy groups: yes    Pain rating: Pain Rating: 0 /10.      Current Medication List:  Scheduled:   ? amoxicillin-clavulanate  1 tablet Oral Q12H SCH   ? celecoxib  100 mg Oral BID   ? lithium  300 mg Oral BID W/ Meals   ? metroNIDAZOLE  500 mg Oral Q12H SCH   ? neomycin-bacitracin-polymyxin (NEOSPORIN) ointment   Topical BID   ? OLANZapine  10 mg Oral Nightly   ? sertraline  50 mg Oral daily       PRN:   benztropine 1 mg Oral Q6H PRN   Or      benztropine mesylate 1 mg Intramuscular Q6H PRN   diphenhydrAMINE 50 mg Oral Nightly PRN   haloperidol 2 mg Oral Q4H PRN   Or      haloperidol lactate 2 mg Intramuscular Q4H PRN   Or      haloperidol 2 mg Oral Q4H PRN   LORazepam 1 mg Oral Q4H PRN   Or      LORazepam 1 mg Oral Q4H PRN   Or      LORazepam 1 mg Intramuscular Q4H PRN        Vital Signs: Last Filed Vitals Signs: 24 Hour Range   BP: 111/62 (03/07 0702) BP: (111-124)/(62-72)

## 2017-02-22 NOTE — H&P
The patient has had no previous psychiatric hospitalizations.      Prior psychiatric medications: The patient did not state her past medications.    Current psychiatric medication: Patient did not state her current medication.  Please see PTA list.      Suicide Risk Assessment:  The patient Denies a history of suicide attempts.  Prior aborted/interrupted suicide attempts: No  Prior intentional self-injury without suicide intent: No  Accessibility of suicide methods (ex firearms): No  Motivations for suicide (stressors): No  Protective factors: Yes, "family and friends".      Violence Assessment:  History of assault/violent behavior: No    Medical Records available :  Record Review: brief.    Psychosocial History:  Current living situation: Lives alone.  The patient was born in (not obtained[place] and raised in ([place] obtained[place] Her parents are (not obtained), and she has (not obtained) siblings. Lova Erline HauRuth Gunnerson has been married 0 times and has 0 kids. She worked as (not obtained) in the past, and currently and receives (not obtained) for finances. At this time She has no legal issues. Estefania Erline HauRuth Klemmer did not state she experienced abuse. She denies a history of being in the [employer] and [employer]itary.   Social supports: "family and friends"  Education: Not obtained during interview.    Spiritual/religious affiliation: not asked.    Medical History:   Head trauma: Not asked  Seizure history: Not asked  Hypertension: Not asked  Diabetes: Not asked  Coronary artery disease: Not asked  CVA: Not asked  LMP: Not asked  Past Medical History:   Diagnosis Date   ? Bipolar 1 disorder    ? Bipolar I disorder with depression 02/05/2017   ? Depression    ? Non-adherence to medical treatment 02/05/2017   ? Personal history of noncompliance with medical treatment, presenting hazards to health      History reviewed. No pertinent surgical history.    Pain:  Complaints of Pain: none    Physical and psychiatric ROS:   Neurological: Not asked.

## 2017-02-22 NOTE — H&P
[facility]  In[facility]iatry  [place], [place]      HISTORY & PHYSICAL/INITIAL PSYCHIATRIC EVALUATION      MRN: [id number]  Patient Name: Cheyenne Rhodes       D.O.B.: 10/13/1996  Date of admission: 02/04/2017  Date of Service: 02/05/2017    Identifying Data: Cheyenne Rhodes is a 21 y.o. y.o. female who is Single [1] and is currentlyNot Employed [3]. The patient is here under a Engineer, productionBaker Act dated 02/04/2017 that says the patient "said she was depressed, having a bad day, and wanted to harm herself". Primary care physician is No primary care provider on file. and her outpatient psychiatrist is none.    Presenting Complaint: When asked if the patient feels she should be here, the patient responded by nodding "yes".      History of Present Illness:   Cheyenne Rhodes is a 21 y.o. female who is here under a Engineer, productionBaker Act dated 02/04/2017 that says the patient "said she was depressed, having a bad day, and wanted to harm herself". During the patient interview she was sleeping and kept her eyes clothes the majority of the time. She did not respond to the questions asked and when she did respond she mumbled inaudible answers to most of the questions. Cheyenne Rhodes endorsed that she was experiencing SI "over the past month" and that she has "family and friends to help" with her condition. Cheyenne Rhodes currently lives alone and believes she should receive treatment. The patient did not respond to questions concerning current medications, HI, or pain.      sleep: yes  appetite changes: no  weight changes: no  energy/anergy: no  interest/pleasure/anhedonia: no  somatic symptoms: no  libido: Not asked.  anxiety/panic: no  guilty/hopeless: no  S.I.B.s/risky behavior: no  any drugs: no  alcohol: no     Depression:  Endorses depressed mood, decreased sleep, decreased concentration, decreased interests, low energy, change of appetite, feelings of guilt, helplessness, or hopelessness.

## 2017-02-22 NOTE — Psych Progress Note
Temp: 36.8 ?C (98.2 ?F) (03/07 16100702) Temp:  [36.7 ?C (98 ?F)-36.8 ?C (98.2 ?F)]    Pulse: 75 (03/07 0702) Pulse:  [75-78]    Resp: 17 (03/07 0702) Resp:  [17]          Respiratory Device: Room Air / (None)               Results for orders placed or performed during the hospital encounter of 02/04/17 (from the past 24 hour(s))   Lithium Level    Collection Time: 02/08/17  5:49 AM   Result Value    Lithium Lvl 0.36       Body mass index is 24.12 kg/(m^2).      Diagnoses:    Patient Active Problem List   Diagnosis   ? Bipolar I disorder with depression   ? Bipolar I disorder with mixed features   ? Non-adherence to medical treatment   ? Candidal vulvovaginitis         Plan:  Patient is still fragile as evidenced by the assessment of mental status and support system. Patient's depression, is uncontrolled and the patient lacks adequate judgement to maintain physical safety and inadequate problem-solving skills, posing risk of immediate decompensation if discharged.     debilitating depression,  and I certify that this patient continues to need, on a daily basis, active treatment furnished directly by or requiring the supervision of inpatient psychiatric facility personnel.  We will continue same medications and treatment plan    Scribe Attestation: I, Earley Abidedward Daly, have acted as a Neurosurgeonscribe for Dr. Denny PeonSoto-Acosta 2:18 PM 02/08/2017     Physician Attestation: I have reviewed and confirmed the information stated by the scribe and made corrections and edits as appropriate.  I have personally provided the services documented by the scribe.      Sherlynn Carbonaul Soto-Acosta, MD

## 2017-02-22 NOTE — Discharge Summary
medication changes and showed adequate improvement. she began attending groups and interacted well with others in the ward milieu. Pt began showing improved insight into condition and the patient was monitored without incident. Cheyenne Rhodes denies, in a very convincing manner, suicidal or homicidal ideas. Pt reached maximum therapeutic in-patient benefit and the decision was made for discharge.     Initial physical examination was done by Dr. Reece Levy or his associate    Consults:   Consults   Procedures   ? Inpatient consult to Int Med for Psych   ? Inpatient consult to Internal Medicine for Psychiatric Patients ([facility] Only)   ? IP consult to Int Med for Psychiatric Patients ([facility] Only)       Significant Diagnostic Studies:   Labs include:   Metabolic Lab Profile:  Lab Results   Component Value Date/Time    NA 141 02/05/2017 03:37 AM    K 3.8 02/05/2017 03:37 AM    CL 101 02/05/2017 03:37 AM    CO2 23 02/05/2017 03:37 AM    BUN 9 02/05/2017 03:37 AM    CREATININE 0.67 02/05/2017 03:37 AM    GLU 84 02/05/2017 03:37 AM    CALCIUM 8.6 02/05/2017 03:37 AM    TPROT 5.8 (L) 02/05/2017 03:37 AM    ALB 3.5 (L) 02/05/2017 03:37 AM    AST 13 (L) 02/05/2017 03:37 AM    ALT 6 (L) 02/05/2017 03:37 AM    EGFR >59 02/05/2017 03:37 AM    TBILI 0.2 02/05/2017 03:37 AM    ALKPHOS 56 02/05/2017 03:37 AM       CBC (with or without Differential):   Lab Results   Component Value Date    WBC 4.41 (L) 02/05/2017    HGB 10.5 (L) 02/05/2017    HCT 34.6 (L) 02/05/2017    MCV 80.3 (L) 02/05/2017    MCH 24.4 (L) 02/05/2017    MCHC 30.3 (L) 02/05/2017    RDW 16.8 (H) 02/05/2017    PLATCOUNT 199 02/05/2017    MPV 11.4 02/05/2017    NEUTROPCT 39 02/05/2017    LYMPHPCT 54 02/05/2017    MONOPCT 5 02/05/2017    EOSPCT 2 02/05/2017    BASOPCT 0 02/05/2017       Fasting Lipid Profile:  No results found for: CHOL, HDL, LDL, TRIG, CHOLHDL    Hemoglobin A1c:  No results found for: HGBA1C    Height: Height: 170.2 cm (5' 7")

## 2017-02-22 NOTE — Discharge Summary
[facility]  In[facility]iatry  [place], [place]      DISCHARGE SUMMARY    MRN: [id number]  Patient Name: Cheyenne Rhodes    D.O.B; 12/12/1995  Date of Admission: 02/04/2017  Date of Discharge:    Admitting Physician: Mardella LaymanPradeep Arora, MD   Discharge Physician: Sherlynn Carbonaul Soto-Acosta, MD      Admission Condition: critical    Discharged Condition: good     Cheyenne Erline HauRuth Rhodes was discharged in an improved condition.    Indication for Admission: suicide attempt/gesture/ideation.    Reason for Admission: Cheyenne Rhodes is a 21 y.o. female who is here under a Engineer, productionBaker Act dated 02/04/2017 that says the patient "said she was depressed, having a bad day, and wanted to harm herself."  During the patient interview, she was sleeping and kept her eyes closed the majority of the time. She did not respond to the questions asked.  When she did respond, she mumbled inaudible answers to most of the questions. Ms. Aline AugustHolmes endorsed that she was experiencing SI "over the past month" and that she has "family and friends to help" with her condition. Ms. Aline AugustHolmes currently lives alone and believes she should receive treatment. The patient did not respond to questions concerning current medications, HI, or pain.    Admission Diagnoses: Suicidal ideation [R45.851]  Depression, acute [F32.9]  Bipolar 1 disorder [F31.9]    Prescriptions Prior to Admission   ? Order #: [id number] Class: Historical Med   ? Order #: [id number] Class: Historical Med   ? Order #: [id number] Class: Historical Med   ? Order #: [id number] Class: Historical Med       Inpatient Course of Stay:    Treatment Rendered: Medication Management  Social Service/DC Planning     Hospital Course:  Leafy Halfmaris Erline HauRuth Rhodes is a 21 y.o. female who [facility]. Please see H&P for further information about the patient's initial presentation. she was started on medications. Relevant labs were reviewed and are below. Pt tolerated all

## 2017-02-22 NOTE — H&P
Monitor for increasing depression  Monitor vital signs for clinical correlation  Maintain compliance  Check lab studies  Medicine consultation    Earley AbideEdward Daly    Scribe Attestation: Sharol RousselI, Edward Daly, have acted as a Neurosurgeonscribe for Sherlynn CarbonSoto-Acosta, Raul, MD 11:53 AM 02/05/2017     Physician Attestation: Marland Kitchen***

## 2017-02-22 NOTE — Psych Progress Note
Temp: 36.8 ?C (98.2 ?F) (03/07 16100702) Temp:  [36.7 ?C (98 ?F)-36.8 ?C (98.2 ?F)]    Pulse: 75 (03/07 0702) Pulse:  [75-78]    Resp: 17 (03/07 0702) Resp:  [17]          Respiratory Device: Room Air / (None)               Results for orders placed or performed during the hospital encounter of 02/04/17 (from the past 24 hour(s))   Lithium Level    Collection Time: 02/08/17  5:49 AM   Result Value    Lithium Lvl 0.36       Body mass index is 24.12 kg/(m^2).      Diagnoses:    Patient Active Problem List   Diagnosis   ? Bipolar I disorder with depression   ? Bipolar I disorder with mixed features   ? Non-adherence to medical treatment   ? Candidal vulvovaginitis         Plan:  Patient is still fragile as evidenced by the assessment of mental status and support system. Patient's ***psychosis, depression, is uncontrolled and the patient lacks adequate judgement to maintain physical safety and inadequate problem-solving skills, posing risk of immediate decompensation if discharged.     { :34992::"***","I certify that this patient continues to need, on a daily basis, active treatment furnished directly by or requiring the supervision of inpatient psychiatric facility personnel."}      Scribe Attestation: I, Earley AbideEdward Daly, have acted as a Neurosurgeonscribe for Dr. Denny PeonSoto-Acosta 2:18 PM 02/08/2017     Physician Attestation: Marland Kitchen***

## 2017-02-22 NOTE — H&P
A Petition for Adjudication of Incompetence to Consent to Treatment and Appointment of a Guardian Advocate will be filed with the court within the time period required by law.  Until the guardian advocate is appointed by the court, a health care surrogate or proxy {actions; will/will not:[id number]} be asked to make treatment decisions for the above-named person.      -To target the patient's *** symptoms, we will start ***. Risks and benefits of medication were discussed with the patient.     - Ativan {PCA dose:408040} PO/IM q4hours PRN agitation  Haldol {Loading Dose:408030} PO/IM q4hours PRN agitation  - benztropine 1mg q6h PO/IM PRN acute dystonia or EPS  - Psychotropic medication reconciliation done. Reviewed with patient the indications, risks, benefits, alternatives, and potential consequences of no treatment with psychotropic medications. Patient was given ample time to ask any questions and was in agreement with the treatment plan.     - Routine laboratory workup ordered and will be reviewed.      - Medical/substance/nutrition Consult will be placed for Dr. Reddy and his team.     -The previous medical record will be reviewed and collateral information   obtained from family, group home and case management if it becomes available.     -The patient will be encouraged to participate in the therapeutic milieu in order to evaluate behavioral and treatment response.     - We are going to refer the patient for individual counseling with our therapist on the unit, Ms Hamilton.     - Will continue to monitor for suicidal or homicidal thoughts as well as monitor for evolution of psychiatric process including development of psychosis.     - We will initiate safety planning and social services regarding aftercare and disposition options. Firearms and other dangerous weapons are encouraged to be removed from the living environment.     - Discharge to occur pending psychiatric stabilization of symptoms.

## 2017-03-01 ENCOUNTER — Inpatient Hospital Stay

## 2017-04-01 DIAGNOSIS — F319 Bipolar disorder, unspecified: Secondary | ICD-10-CM

## 2017-04-01 DIAGNOSIS — M25571 Pain in right ankle and joints of right foot: Secondary | ICD-10-CM

## 2017-04-01 DIAGNOSIS — D573 Sickle-cell trait: Secondary | ICD-10-CM

## 2017-04-01 DIAGNOSIS — B373 Candidiasis of vulva and vagina: Secondary | ICD-10-CM

## 2017-04-01 DIAGNOSIS — Z79899 Other long term (current) drug therapy: Secondary | ICD-10-CM

## 2017-04-01 DIAGNOSIS — S93401A Sprain of unspecified ligament of right ankle, initial encounter: Principal | ICD-10-CM

## 2017-04-01 DIAGNOSIS — F329 Major depressive disorder, single episode, unspecified: Secondary | ICD-10-CM

## 2017-04-01 DIAGNOSIS — R569 Unspecified convulsions: Secondary | ICD-10-CM

## 2017-04-01 DIAGNOSIS — R561 Post traumatic seizures: Secondary | ICD-10-CM

## 2017-04-01 DIAGNOSIS — Z9119 Patient's noncompliance with other medical treatment and regimen: Secondary | ICD-10-CM

## 2017-04-01 DIAGNOSIS — S93409A Sprain of unspecified ligament of unspecified ankle, initial encounter: Secondary | ICD-10-CM

## 2017-04-01 MED ORDER — IBUPROFEN 800 MG PO TABS
800 mg | Freq: Once | ORAL | Status: CP
Start: 2017-04-01 — End: ?

## 2017-04-02 ENCOUNTER — Inpatient Hospital Stay: Admit: 2017-04-02 | Discharge: 2017-04-02

## 2017-04-02 NOTE — ED Provider Notes
?   Depression    ? Non-adherence to medical treatment 02/05/2017   ? Personal history of noncompliance with medical treatment, presenting hazards to health    ? Seizures, post-traumatic    ? Sickle cell trait        History reviewed. No pertinent surgical history.    History reviewed. No pertinent family history.    Social History     Social History   ? Marital status: Single     Spouse name: N/A   ? Number of children: N/A   ? Years of education: N/A     Social History Main Topics   ? Smoking status: Never Smoker   ? Smokeless tobacco: Never Used   ? Alcohol use No   ? Drug use: No   ? Sexual activity: Not Asked     Other Topics Concern   ? None     Social History Narrative       Review of Systems   Constitutional: Negative for fever and chills.   HENT: Negative for neck pain.    Respiratory: Negative for shortness of breath.    Cardiovascular: Negative for chest pain.   Gastrointestinal: Negative for nausea, vomiting and abdominal pain.   Musculoskeletal: Positive for gait problem. Negative for back pain, stiffness and peripheral edema.   All other systems reviewed and are negative.      Physical Exam       ED Triage Vitals   BP 04/01/17 2054 157/81   Pulse 04/01/17 2054 81   Resp 04/01/17 2054 18   Temp 04/01/17 2054 36.9 ?C (98.4 ?F)   Temp src 04/01/17 2054 Oral   Height 04/01/17 2054 1.702 m   Weight 04/01/17 2054 68 kg   SpO2 04/01/17 2054 100 %   BMI (Calculated) 04/01/17 2054 23.54             Physical Exam   Constitutional: She is oriented to person, place, and time. She appears well-developed and well-nourished. No distress.   HENT:   Head: Normocephalic and atraumatic.   Mouth/Throat: Oropharynx is clear and moist.   Eyes: Conjunctivae and EOM are normal. Pupils are equal, round, and reactive to light.   Neck: Normal range of motion. Neck supple.   Cardiovascular: Normal rate, regular rhythm, normal heart sounds and intact distal pulses.  Exam reveals no gallop and no friction rub.    No murmur heard.

## 2017-04-02 NOTE — ED Notes
Time of discharge: 2241  PM., Patient discharged to  Home.  Patient discharged  via crutches. to exit with belongings in  Stable condition.  Patient escorted by  no one., Written discharge instructions given to  patient.  Patient/recipient  verbalizes discharge instructions. W/ cab ride.

## 2017-04-02 NOTE — ED Provider Notes
Pulmonary/Chest: Effort normal and breath sounds normal. No respiratory distress. She has no wheezes. She has no rales. She exhibits no tenderness.   Abdominal: Soft. Bowel sounds are normal. She exhibits no distension and no mass. There is no tenderness. There is no rebound and no guarding.   Musculoskeletal: Normal range of motion.        Right ankle: She exhibits normal range of motion and no swelling. Tenderness. AITFL tenderness found.   Neurological: She is alert and oriented to person, place, and time.   Skin: Skin is warm and dry. She is not diaphoretic.   Psychiatric: She has a normal mood and affect. Her behavior is normal. Judgment and thought content normal.   Nursing note and vitals reviewed.      Differential DDx: sprain, fx, dislocation, other    Is this an Emergent Medical Condition? Yes - Severe Pain/Acute Onset of Symptons  409.901 FS  641.19 FS  627.732 (16) FS    ED Workup   Procedures    Labs:  -   POCT URINE PREGNANCY - Normal       Result Value Ref Range    Preg Test, Urine (POC) Negative  Negative         Imaging (Read by ED Provider):  not applicable      EKG (Read by ED Provider):  not applicable        ED Course & Re-Evaluation     ED Course     VS wnl. Pt resting comfortably in NAD. Exam w/o swelling of ankel and no ttp over medial or lateral malleoli. No ttp over navicular bone or base of 5th metatarsal. RUles out of XR by [place] ankle and foot rules. Sx suggestive of sprain. Given motrin and ankel placed i ace wrap. Given crutches. Given return precautions and instructions for symptomatic management. The pt/caregiver understands that, at this time, there is no indication for further inpatient workup or admission, but that the patient may require further diagnostics/intervention for worsening, changing, or persistent symptoms at a later time. Any of the above should prompt immediate follow-up with the PCP or return to the ED if the patient is
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
## 2017-04-02 NOTE — ED Provider Notes
?   Non-adherence to medical treatment 02/05/2017   ? Personal history of noncompliance with medical treatment, presenting hazards to health    ? Seizures, post-traumatic    ? Sickle cell trait        History reviewed. No pertinent surgical history.    History reviewed. No pertinent family history.    Social History     Social History   ? Marital status: Single     Spouse name: N/A   ? Number of children: N/A   ? Years of education: N/A     Social History Main Topics   ? Smoking status: Never Smoker   ? Smokeless tobacco: Never Used   ? Alcohol use No   ? Drug use: No   ? Sexual activity: Not Asked     Other Topics Concern   ? None     Social History Narrative       Review of Systems   Constitutional: Negative for fever and chills.   HENT: Negative for neck pain.    Respiratory: Negative for shortness of breath.    Cardiovascular: Negative for chest pain.   Gastrointestinal: Negative for nausea, vomiting and abdominal pain.   Musculoskeletal: Positive for gait problem. Negative for back pain, stiffness and peripheral edema.   All other systems reviewed and are negative.      Physical Exam       ED Triage Vitals   BP 04/01/17 2054 157/81   Pulse 04/01/17 2054 81   Resp 04/01/17 2054 18   Temp 04/01/17 2054 36.9 ?C (98.4 ?F)   Temp src 04/01/17 2054 Oral   Height 04/01/17 2054 1.702 m   Weight 04/01/17 2054 68 kg   SpO2 04/01/17 2054 100 %   BMI (Calculated) 04/01/17 2054 23.54             Physical Exam   Constitutional: She is oriented to person, place, and time. She appears well-developed and well-nourished. No distress.   HENT:   Head: Normocephalic and atraumatic.   Mouth/Throat: Oropharynx is clear and moist.   Eyes: Conjunctivae and EOM are normal. Pupils are equal, round, and reactive to light.   Neck: Normal range of motion. Neck supple.   Cardiovascular: Normal rate, regular rhythm, normal heart sounds and intact distal pulses.  Exam reveals no gallop and no friction rub.    No murmur heard.

## 2017-04-02 NOTE — ED Provider Notes
History   No chief complaint on file.      HPI    Allergies   Allergen Reactions   ? Chocolate Angioedema   ? Risperidone Other (See Comments)     "I dont really know, I just know I'm allergic"       Patient's Medications   New Prescriptions    No medications on file   Previous Medications    LITHIUM 300 MG PO CAPSULE    Take 1 capsule by mouth 2 times daily (with meals). Reasons: Manic-Depression    OLANZAPINE (ZYPREXA) 10 MG PO TABLET    Take 1 tablet by mouth nightly at bedtime. Reasons: Manic-Depression    SERTRALINE (ZOLOFT) 50 MG PO TABLET    Take 1 tablet by mouth daily. Reasons: Depression   Modified Medications    No medications on file   Discontinued Medications    No medications on file       Past Medical History:   Diagnosis Date   ? Bipolar 1 disorder    ? Bipolar I disorder with depression 02/05/2017   ? Candidal vulvovaginitis 02/08/2017   ? Depression    ? Non-adherence to medical treatment 02/05/2017   ? Personal history of noncompliance with medical treatment, presenting hazards to health    ? Seizures, post-traumatic    ? Sickle cell trait        History reviewed. No pertinent surgical history.    History reviewed. No pertinent family history.    Social History     Social History   ? Marital status: Single     Spouse name: N/A   ? Number of children: N/A   ? Years of education: N/A     Social History Main Topics   ? Smoking status: Never Smoker   ? Smokeless tobacco: Never Used   ? Alcohol use No   ? Drug use: No   ? Sexual activity: Not Asked     Other Topics Concern   ? None     Social History Narrative       Review of Systems    Physical Exam     ED Triage Vitals   BP 04/01/17 2054 157/81   Pulse 04/01/17 2054 81   Resp 04/01/17 2054 18   Temp 04/01/17 2054 36.9 ?C (98.4 ?F)   Temp src 04/01/17 2054 Oral   Height 04/01/17 2054 1.702 m   Weight 04/01/17 2054 68 kg   SpO2 04/01/17 2054 100 %   BMI (Calculated) 04/01/17 2054 23.54             Physical Exam    Differential DDx: ***
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
## 2017-04-02 NOTE — ED Provider Notes
unable to be seen by the PCP. The importance of follow-up and return precautions were discussed. The pt/caregiver verbalized understanding and their questions were answered. Discharge instructions and information were provided.  Anne Shutter, MD 10:08 PM 04/01/2017      MDM   Decide to obtain history from someone other than the patient: No    Decide to obtain previous medical records: No    Clinical Lab Test(s): N/A    Diagnostic Tests (Radiology, EKG): N/A    Independent Visualization (ED [place], Wet Prep, Other): No    Discussed patient with NON-ED Provider: None      ED Disposition   ED Disposition: Discharge      ED Clinical Impression   ED Clinical Impression:   Acute right ankle pain  Mild ankle sprain, initial encounter      ED Patient Status   Patient Status:   Good        ED Medical Evaluation Initiated   Medical Evaluation Initiated:   Yes, filed at 04/01/17 2140  by Anne Shutter, MD

## 2017-04-02 NOTE — ED Provider Notes
unable to be seen by the PCP. The importance of follow-up and return precautions were discussed. The pt/caregiver verbalized understanding and their questions were answered. Discharge instructions and information were provided.  Anne Shutter, MD 10:08 PM 04/01/2017      MDM   Decide to obtain history from someone other than the patient: No    Decide to obtain previous medical records: No    Clinical Lab Test(s): N/A    Diagnostic Tests (Radiology, EKG): N/A    Independent Visualization (ED [place], Wet Prep, Other): No    Discussed patient with NON-ED Provider: None      ED Disposition   ED Disposition: Discharge      ED Clinical Impression   ED Clinical Impression:   Acute right ankle pain  Mild ankle sprain, initial encounter      ED Patient Status   Patient Status:   Good        ED Medical Evaluation Initiated   Medical Evaluation Initiated:   Yes, filed at 04/01/17 2140  by Anne Shutter, MD             Anne Shutter, MD  Resident  04/01/17 [id number]
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
## 2017-04-02 NOTE — ED Provider Notes
Is this an Emergent Medical Condition? {SH ED EMERGENT MEDICAL CONDITION:[id number]}  409.901 FS  641.19 FS  627.732 (16) FS    ED Workup   Procedures    Labs:  -   POCT URINE PREGNANCY - Normal       Result Value Ref Range    Preg Test, Urine (POC) Negative  Negative         Imaging (Read by ED Provider):  {Imaging findings:[phone]}      EKG (Read by ED Provider):  {EKG findings:[phone]}        ED Course & Re-Evaluation     ED Course         MDM   Decide to obtain history from someone other than the patient: {SH ED Lamonte Sakai MDM - OBTAIN ZOXWRUE:45409}    Decide to obtain previous medical records: [facility] ED Lamonte Sakai MDM - PREVIOUS MED REC - NO WJX:91478}    Clinical Lab Test(s): {SH ED Lamonte Sakai MDM ORDERED AND REVIEWED:28124}    Diagnostic Tests (Radiology, EKG): {SH ED Lamonte Sakai MDM ORDERED AND REVIEWED:28124}    Independent Visualization (ED [place], Wet Prep, Other): {SH ED Lamonte Sakai MDM NO YES GNFAOZHY:86578}    Discussed patient with NON-ED Provider: {SH ED Lamonte Sakai MDM - ANOTHER PROVIDER:28381}      ED Disposition   ED Disposition: No ED Disposition Set      ED Clinical Impression   ED Clinical Impression:   No Clinical Impression Set      ED Patient Status   Patient Status:   {SH ED [facility] PATIENT STATUS:[phone]}        ED Medical Evaluation Initiated   Medical Evaluation Initiated:   Yes, filed at 04/01/17 2140  by Anne Shutter, MD

## 2017-04-02 NOTE — ED Provider Notes
Pulmonary/Chest: Effort normal and breath sounds normal. No respiratory distress. She has no wheezes. She has no rales. She exhibits no tenderness.   Abdominal: Soft. Bowel sounds are normal. She exhibits no distension and no mass. There is no tenderness. There is no rebound and no guarding.   Musculoskeletal: Normal range of motion.        Right ankle: She exhibits normal range of motion and no swelling. Tenderness. AITFL tenderness found.   Neurological: She is alert and oriented to person, place, and time.   Skin: Skin is warm and dry. She is not diaphoretic.   Psychiatric: She has a normal mood and affect. Her behavior is normal. Judgment and thought content normal.   Nursing note and vitals reviewed.      Differential DDx: sprain, fx, other    Is this an Emergent Medical Condition? Yes - Severe Pain/Acute Onset of Symptons  409.901 FS  641.19 FS  627.732 (16) FS    ED Workup   Procedures    Labs:  -   POCT URINE PREGNANCY - Normal       Result Value Ref Range    Preg Test, Urine (POC) Negative  Negative         Imaging (Read by ED Provider):  not applicable      EKG (Read by ED Provider):  not applicable        ED Course & Re-Evaluation     ED Course     VS wnl. Pt resting comfortably in NAD. Exam w/o swelling of ankel and no ttp over medial or lateral malleoli. No ttp over navicular bone or base of 5th metatarsal. RUles out of XR by [place] ankle and foot rules. Sx suggestive of sprain. Given motrin and ankel placed i ace wrap. Given crutches. Given return precautions and instructions for symptomatic management. The pt/caregiver understands that, at this time, there is no indication for further inpatient workup or admission, but that the patient may require further diagnostics/intervention for worsening, changing, or persistent symptoms at a later time. Any of the above should prompt immediate follow-up with the PCP or return to the ED if the patient is

## 2017-04-05 DIAGNOSIS — S59901A Unspecified injury of right elbow, initial encounter: Secondary | ICD-10-CM

## 2017-04-05 DIAGNOSIS — S0990XA Unspecified injury of head, initial encounter: Secondary | ICD-10-CM

## 2017-04-05 DIAGNOSIS — Z79899 Other long term (current) drug therapy: Secondary | ICD-10-CM

## 2017-04-05 DIAGNOSIS — F329 Major depressive disorder, single episode, unspecified: Secondary | ICD-10-CM

## 2017-04-05 DIAGNOSIS — D573 Sickle-cell trait: Secondary | ICD-10-CM

## 2017-04-05 DIAGNOSIS — S90519A Abrasion, unspecified ankle, initial encounter: Secondary | ICD-10-CM

## 2017-04-05 DIAGNOSIS — S99912A Unspecified injury of left ankle, initial encounter: Secondary | ICD-10-CM

## 2017-04-05 DIAGNOSIS — F319 Bipolar disorder, unspecified: Secondary | ICD-10-CM

## 2017-04-05 DIAGNOSIS — B373 Candidiasis of vulva and vagina: Secondary | ICD-10-CM

## 2017-04-05 DIAGNOSIS — R569 Unspecified convulsions: Secondary | ICD-10-CM

## 2017-04-05 DIAGNOSIS — R561 Post traumatic seizures: Secondary | ICD-10-CM

## 2017-04-05 DIAGNOSIS — Z9119 Patient's noncompliance with other medical treatment and regimen: Secondary | ICD-10-CM

## 2017-04-05 MED ORDER — TETANUS-DIPHTH-ACELL PERTUSSIS 5-2-15.5 LF-MCG/0.5 IM SUSP
.5 mL | Freq: Once | INTRAMUSCULAR | Status: CP
Start: 2017-04-05 — End: ?

## 2017-04-06 ENCOUNTER — Emergency Department: Admit: 2017-04-06 | Discharge: 2017-04-06

## 2017-04-06 ENCOUNTER — Inpatient Hospital Stay: Admit: 2017-04-06 | Discharge: 2017-04-06

## 2017-04-06 NOTE — Consults
concentration, decreased interests, low energy, change of appetite, feelings of guilt, helplessness, or hopelessness.   ?  Mania:  Denies recent manic symptoms or negative consequences of past mania, including increased goal-directed or high risk activity, impulsivity, grandiosity, distractability, irritability, decreased need for sleep, elevated mood, increased rate of speech, and racing thoughts.  ?  Anxiety:  Denies significant anxiety, including excess worrying, restlessness or feeling edgy, increased muscle tension, decreased sleep, and decreased concentration related to anxiety.   ?  PTSD:  Denies major stress/trauma and symptoms related to trauma, including recurrent disturbing dreams, avoidance of troubling memories, amnesia for key events of trauma, unwanted images/flashbacks, markedly diminished interest, emotional numbing, hypervigilance, and exaggerated startle response.   ?  OCD:   Denies obsessive thoughts and compulsions, including repetitive washing/cleaning, counting/checking, or organizing/praying behaviors and intrusive or persistent thoughts that are recognized as excessive or irrational.  ?  Panic Disorder: Denies symptoms of panic attacks, including acute onset of fear and anxiety accompanied by trembling, palpitations, nausea/chills, choking/chest pain, sweating, the fear of dying or going crazy, anticipatory anxiety, avoidance, and agoraphobia.  ?  Psychosis:  Denies psychotic symptoms including auditory hallucinations, visual hallucinations, tactile hallucinations, illusions, and delusional beliefs.  ?  Memory:  Denies short or long term memory problems, word-finding difficulties, problems remembering friends/family.  ?  ?  ADHD: Patient denied symptoms of inattention and hyperactivity.   ?  Eating Patterns:  Patient denies purposeful restricting of energy intake relative to requirements, and binging/purging to obtain desired appearance
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
## 2017-04-06 NOTE — Consults
was linear. She endorses no hallucinations or delusions and denies suicidal or homicidal ideation. Fund of knowledge was adequate. Focus and concentration are inadequate based on ability to remain engaged, tracking and following the flow of conversation. Immediate and recent memory were intact based on ability to recall personal information and recent events.  Her insight was poor and her judgment was poor.  ?  ?  There were not abnormal involuntary movement present. Her gait was steady.  ?  ?  ?  ?  Interpretive Summary:   Cheyenne Rhodes is a 21 y.o.female who is here under a Engineer, productionBaker Act dated 02/04/2017 that says the patient "said she was depressed, having a bad day, and wanted to harm herself". During the patient interview she was sleeping and kept her eyes clothes the majority of the time. She did not respond to the questions asked and when she did respond she mumbled inaudible answers to most of the questions.  At this moment in time the patient is in need of this level of care due to imminent danger to self or others. The preceding assessment is based on the available information at the time of this evaluation.  ?  ?  Justification for admission:  suicide attempt/gesture/ideation.  ?  Admission Diagnoses:  DSM-V Format  Present on Admission:  ? Bipolar I disorder with depression  ? (Principal) Bipolar I disorder with mixed features  ? Non-adherence to medical treatment  ? Candidal vulvovaginitis  ?  ?  Prognosis: poor  ?  Estimated Length of Stay:  4-6 days  ?  Initial Treatment Plan:  - At this moment in time, we are going to keep Cheyenne Rhodes for further treatment and stabilization.   Cheyenne Rhodes has been deemed   ?  Competent to provide express and informed consent, as defined above, for voluntary admission to this facility and is competent to provide express and informed consent for treatment. She has the consistent capacity to
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
## 2017-04-06 NOTE — ED Provider Notes
meals). Reasons: Manic-DepressionDisp-60 capsule, R-1, E-Prescribing      OLANZapine (ZYPREXA) 10 MG PO Tablet Take 1 tablet by mouth nightly at bedtime. Reasons: Manic-DepressionDisp-30 tablet, R-1, E-Prescribing      sertraline (ZOLOFT) 50 MG PO Tablet Take 1 tablet by mouth daily. Reasons: DepressionDisp-30 tablet, R-1, E-Prescribing             Past Medical History:   Diagnosis Date   ? Bipolar 1 disorder    ? Bipolar I disorder with depression 02/05/2017   ? Candidal vulvovaginitis 02/08/2017   ? Depression    ? Non-adherence to medical treatment 02/05/2017   ? Personal history of noncompliance with medical treatment, presenting hazards to health    ? Seizures, post-traumatic    ? Sickle cell trait        History reviewed. No pertinent surgical history.    History reviewed. No pertinent family history.    Social History     Social History   ? Marital status: Single     Spouse name: N/A   ? Number of children: N/A   ? Years of education: N/A     Social History Main Topics   ? Smoking status: Never Smoker   ? Smokeless tobacco: Never Used   ? Alcohol use No   ? Drug use: No   ? Sexual activity: Not Asked     Other Topics Concern   ? None     Social History Narrative       Review of Systems   Constitutional: Negative for decreased responsiveness.   HENT: Negative for hearing loss and neck pain.    Eyes: Negative for visual disturbance.   Respiratory: Negative for cough.    Cardiovascular: Negative for chest pain.   Gastrointestinal: Negative for nausea, vomiting, abdominal pain and bowel incontinence.   Genitourinary: Negative for bladder incontinence.   Neurological: Negative for tingling, focal weakness, seizures, loss of consciousness, weakness, light-headedness, numbness and headaches.   Psychiatric/Behavioral: Negative for memory loss.   All other systems reviewed and are negative.      Physical Exam       ED Triage Vitals   BP 04/05/17 2132 117/72   Pulse 04/05/17 2132 89   Resp 04/05/17 2132 18

## 2017-04-06 NOTE — ED Provider Notes
OLANZAPINE (ZYPREXA) 10 MG PO TABLET    Take 1 tablet by mouth nightly at bedtime. Reasons: Manic-Depression    SERTRALINE (ZOLOFT) 50 MG PO TABLET    Take 1 tablet by mouth daily. Reasons: Depression   Modified Medications    No medications on file   Discontinued Medications    No medications on file       Past Medical History:   Diagnosis Date   ? Bipolar 1 disorder    ? Bipolar I disorder with depression 02/05/2017   ? Candidal vulvovaginitis 02/08/2017   ? Depression    ? Non-adherence to medical treatment 02/05/2017   ? Personal history of noncompliance with medical treatment, presenting hazards to health    ? Seizures, post-traumatic    ? Sickle cell trait        History reviewed. No pertinent surgical history.    History reviewed. No pertinent family history.    Social History     Social History   ? Marital status: Single     Spouse name: N/A   ? Number of children: N/A   ? Years of education: N/A     Social History Main Topics   ? Smoking status: Never Smoker   ? Smokeless tobacco: Never Used   ? Alcohol use No   ? Drug use: No   ? Sexual activity: Not Asked     Other Topics Concern   ? None     Social History Narrative       Review of Systems   Constitutional: Negative for decreased responsiveness.   HENT: Negative for hearing loss and neck pain.    Eyes: Negative for visual disturbance.   Respiratory: Negative for cough.    Cardiovascular: Negative for chest pain.   Gastrointestinal: Negative for nausea, vomiting, abdominal pain and bowel incontinence.   Genitourinary: Negative for bladder incontinence.   Neurological: Negative for tingling, focal weakness, seizures, loss of consciousness, weakness, light-headedness, numbness and headaches.   Psychiatric/Behavioral: Negative for memory loss.   All other systems reviewed and are negative.      Physical Exam     ED Triage Vitals   BP 04/05/17 2132 117/72   Pulse 04/05/17 2132 89   Resp 04/05/17 2132 18   Temp 04/05/17 2132 37.2 ?C (99 ?F)
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
## 2017-04-06 NOTE — ED Provider Notes
OLANZAPINE (ZYPREXA) 10 MG PO TABLET    Take 1 tablet by mouth nightly at bedtime. Reasons: Manic-Depression    SERTRALINE (ZOLOFT) 50 MG PO TABLET    Take 1 tablet by mouth daily. Reasons: Depression   Modified Medications    No medications on file   Discontinued Medications    No medications on file       Past Medical History:   Diagnosis Date   ? Bipolar 1 disorder    ? Bipolar I disorder with depression 02/05/2017   ? Candidal vulvovaginitis 02/08/2017   ? Depression    ? Non-adherence to medical treatment 02/05/2017   ? Personal history of noncompliance with medical treatment, presenting hazards to health    ? Seizures, post-traumatic    ? Sickle cell trait        History reviewed. No pertinent surgical history.    History reviewed. No pertinent family history.    Social History     Social History   ? Marital status: Single     Spouse name: N/A   ? Number of children: N/A   ? Years of education: N/A     Social History Main Topics   ? Smoking status: Never Smoker   ? Smokeless tobacco: Never Used   ? Alcohol use No   ? Drug use: No   ? Sexual activity: Not Asked     Other Topics Concern   ? None     Social History Narrative       Review of Systems   Constitutional: Negative for decreased responsiveness.   HENT: Negative for hearing loss and neck pain.    Eyes: Negative for visual disturbance.   Respiratory: Negative for cough.    Cardiovascular: Negative for chest pain.   Gastrointestinal: Negative for nausea, vomiting, abdominal pain and bowel incontinence.   Genitourinary: Negative for bladder incontinence.   Neurological: Negative for tingling, focal weakness, seizures, loss of consciousness, weakness, light-headedness, numbness and headaches.   Psychiatric/Behavioral: Negative for memory loss.   All other systems reviewed and are negative.      Physical Exam       ED Triage Vitals   BP 04/05/17 2132 117/72   Pulse 04/05/17 2132 89   Resp 04/05/17 2132 18   Temp 04/05/17 2132 37.2 ?C (99 ?F)
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
## 2017-04-06 NOTE — Consults
?   Personal history of noncompliance with medical treatment, presenting hazards to health ?   ?  History reviewed. No pertinent surgical history.  ?  Pain:  Complaints of Pain: none  ?  Physical and psychiatric ROS:   Neurological: Not asked.  Cardiovascular: Not asked.  Respiratory: Not asked.  GI: Not asked  Musculoskeletal: Not asked  Remainder of the pertinent ROS reviewed with Patient:  none  ?  BMI: Body mass index is 24.12 kg/(m^2).   ?  Allergies:         Allergies   Allergen Reactions   ? Chocolate Angioedema   ? Risperidone Other (See Comments)   ? ? "I dont really know, I just know I'm allergic"   ?  ?   Medications?prior?to?the?current?inpatient?admission    No prescriptions prior to admission.      ?  ?  ?  ?  Drug and Alcohol History:   The patient was not asked about alcohol use, drinking (not obtained) per week.   ?  The patient was not asked about any current or past use of illicit drugs.  ?   The patient was not asked about any history of legal problems related to drugs or alcohol and was not asked about any history of drug or alcohol detox or rehab treatment.   Previous treatment: none  The patient was not asked about any current or past use of tobacco.  ?  ?  Current Outpatient substance treatment: No    ?  ??  Family  History:   Psychiatric: Not asked  Suicide: Not asked  Substance Abuse: Not asked  Physical Problems: Not asked  ?  Mental Status Examination:  ?  Mental Status:   Cheyenne Rhodes was drowsy and asleep and oriented to person, place. She appeared stated age her stated age of 21 y.o.. She appears well-developed, well-nourished with fair grooming and hygiene. She presented as calm and her eye contact was generally poor throughout the interview. Psychomotor activity was normal and she displayed no abnormal or involuntary movements. Her gait was within normal limits. Her speech was soft. Her mood was "irritable". Her affect was stable. Thought process
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
## 2017-04-06 NOTE — ED Provider Notes
Bilirubin, Indirect 0.1  8mg /dL mg/dL    Alkaline Phosphatase 55  35 - 104 IU/L    AST 19  14 - 33 IU/L    ALT 9 (*) 10 - 42 IU/L    Total Protein 6.9  6.5 - 8.3 g/dL    ALBUMIN/GLOBULIN RATIO 1.6  (calc)    Calc Total Globuin 2.7  gm/dL   SALICYLATE LEVEL - Abnormal     Salicylate Lvl <0.3 (*) 3.0 - 29.0 mg/dL   CBC AUTODIFF - Abnormal     WBC 6.29  4.5 - 11 x10E3/uL    RBC 4.39  4.20 - 5.40 x10E6/uL    Hemoglobin 10.9 (*) 12.0 - 16.0 g/dL    Hematocrit 14.735.5 (*) 37.0 - 47.0 %    MCV 80.9 (*) 82.0 - 101.0 fl    MCH 24.8 (*) 27.0 - 34.0 pg    MCHC 30.7 (*) 31.0 - 36.0 g/dL    RDW 82.918.6 (*) 56.212.0 - 16.1 %    Platelet Count 220  140 - 440 thou/cu mm    MPV 11.4  9.5 - 11.5 fl    nRBC % 0.0  0.0 - 1.0 %    Absolute NRBC Count 0.00      Neutrophils % 43.4  34.0 - 73.0 %    Lymphocytes % 46.3 (*) 25.0 - 45.0 %    Monocytes % 7.8 (*) 2.0 - 6.0 %    Eosinophils % 2.1  1.0 - 4.0 %    Immature Granulocytes % 0.2  0.0 - 2.0 %    Neutrophils Absolute 2.74  1.80 - 8.70 x10E3/uL    Lymphocytes Absolute 2.91  x10E3/uL    Monocytes Absolute 0.49  x10E3/uL    Eosinophils Absolute 0.13  x10E3/uL    Basophil Absolute 0.01  x10E3/uL    Absolute Immature Granulocytes 0.01 (*) 0 - 0 x10E3/uL    Basophils % 0.2  0 - 1 %   LIPASE - Normal    Lipase 41  0 - 60 U/L   ACETAMINOPHEN LEVEL - Normal    Acetaminophen Level <1.0  0.0 - 35.0 mcg/mL   OSMOLALITY BLOOD - Normal    Osmolality 295  274 - 298 mosm/Kg   POCT GLUCOSE - Normal    Glucose (Meter) 73  60.0 - 99.0 mg/dL   POCT TROPININ I - Normal    Troponin I (Point of Care) <0.05  0.00 - 0.23 ng/mL   BASIC METABOLIC PANEL    Sodium 143  130135 - 145 mmol/L    Potassium 4.2  3.3 - 4.6 mmol/L    Chloride 109  101 - 110 mmol/L    CO2 21  21 - 29 mmol/L    Urea Nitrogen 10  6 - 22 mg/dL    Creatinine 8.650.67  7.840.51 - 0.95 mg/dL    BUN/Creatinine Ratio 14.9  6.0 - 22.0 (calc)    Glucose 75  71 - 99 mg/dL    Calcium 9.1  8.6 - 69.610.0 mg/dL    Osmolality Calc 295.2282.7      Anion Gap 13  4 - 16 mmol/L
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
## 2017-04-06 NOTE — ED Notes
Time of discharge: 0140  AM., Patient discharged to  Home.  Patient discharged  ambulatory. to exit with belongings in  Stable condition.  Patient escorted by  friend., Written discharge instructions given to  patient.  Patient/recipient  verbalizes discharge instructions.

## 2017-04-06 NOTE — ED Provider Notes
Bilirubin, Direct 0.1  0.0 - 0.2 mg/dL    Bilirubin, Indirect 0.1  8mg /dL mg/dL    Alkaline Phosphatase 55  35 - 104 IU/L    AST 19  14 - 33 IU/L    ALT 9 (*) 10 - 42 IU/L    Total Protein 6.9  6.5 - 8.3 g/dL    ALBUMIN/GLOBULIN RATIO 1.6  (calc)    Calc Total Globuin 2.7  gm/dL   SALICYLATE LEVEL - Abnormal     Salicylate Lvl <0.3 (*) 3.0 - 29.0 mg/dL   CBC AUTODIFF - Abnormal     WBC 6.29  4.5 - 11 x10E3/uL    RBC 4.39  4.20 - 5.40 x10E6/uL    Hemoglobin 10.9 (*) 12.0 - 16.0 g/dL    Hematocrit 16.135.5 (*) 37.0 - 47.0 %    MCV 80.9 (*) 82.0 - 101.0 fl    MCH 24.8 (*) 27.0 - 34.0 pg    MCHC 30.7 (*) 31.0 - 36.0 g/dL    RDW 09.618.6 (*) 04.512.0 - 16.1 %    Platelet Count 220  140 - 440 thou/cu mm    MPV 11.4  9.5 - 11.5 fl    nRBC % 0.0  0.0 - 1.0 %    Absolute NRBC Count 0.00      Neutrophils % 43.4  34.0 - 73.0 %    Lymphocytes % 46.3 (*) 25.0 - 45.0 %    Monocytes % 7.8 (*) 2.0 - 6.0 %    Eosinophils % 2.1  1.0 - 4.0 %    Immature Granulocytes % 0.2  0.0 - 2.0 %    Neutrophils Absolute 2.74  1.80 - 8.70 x10E3/uL    Lymphocytes Absolute 2.91  x10E3/uL    Monocytes Absolute 0.49  x10E3/uL    Eosinophils Absolute 0.13  x10E3/uL    Basophil Absolute 0.01  x10E3/uL    Absolute Immature Granulocytes 0.01 (*) 0 - 0 x10E3/uL    Basophils % 0.2  0 - 1 %   LIPASE - Normal    Lipase 41  0 - 60 U/L   ACETAMINOPHEN LEVEL - Normal    Acetaminophen Level <1.0  0.0 - 35.0 mcg/mL   OSMOLALITY BLOOD - Normal    Osmolality 295  274 - 298 mosm/Kg   POCT GLUCOSE - Normal    Glucose (Meter) 73  60.0 - 99.0 mg/dL   POCT TROPININ I - Normal    Troponin I (Point of Care) <0.05  0.00 - 0.23 ng/mL   BASIC METABOLIC PANEL    Sodium 143  409135 - 145 mmol/L    Potassium 4.2  3.3 - 4.6 mmol/L    Chloride 109  101 - 110 mmol/L    CO2 21  21 - 29 mmol/L    Urea Nitrogen 10  6 - 22 mg/dL    Creatinine 8.110.67  9.140.51 - 0.95 mg/dL    BUN/Creatinine Ratio 14.9  6.0 - 22.0 (calc)    Glucose 75  71 - 99 mg/dL    Calcium 9.1  8.6 - 78.210.0 mg/dL
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
## 2017-04-13 DIAGNOSIS — Z975 Presence of (intrauterine) contraceptive device: Secondary | ICD-10-CM

## 2017-04-13 DIAGNOSIS — Z79899 Other long term (current) drug therapy: Secondary | ICD-10-CM

## 2017-04-13 DIAGNOSIS — L0100 Impetigo, unspecified: Secondary | ICD-10-CM

## 2017-04-13 DIAGNOSIS — R1084 Generalized abdominal pain: Secondary | ICD-10-CM

## 2017-04-13 DIAGNOSIS — J029 Acute pharyngitis, unspecified: Secondary | ICD-10-CM

## 2017-04-13 DIAGNOSIS — R197 Diarrhea, unspecified: Secondary | ICD-10-CM

## 2017-04-13 DIAGNOSIS — R6883 Chills (without fever): Secondary | ICD-10-CM

## 2017-04-13 DIAGNOSIS — R109 Unspecified abdominal pain: Principal | ICD-10-CM

## 2017-04-13 DIAGNOSIS — N941 Unspecified dyspareunia: Secondary | ICD-10-CM

## 2017-04-13 DIAGNOSIS — F329 Major depressive disorder, single episode, unspecified: Secondary | ICD-10-CM

## 2017-04-13 DIAGNOSIS — F319 Bipolar disorder, unspecified: Secondary | ICD-10-CM

## 2017-04-13 DIAGNOSIS — Z9119 Patient's noncompliance with other medical treatment and regimen: Secondary | ICD-10-CM

## 2017-04-13 DIAGNOSIS — R112 Nausea with vomiting, unspecified: Secondary | ICD-10-CM

## 2017-04-13 DIAGNOSIS — Z3202 Encounter for pregnancy test, result negative: Secondary | ICD-10-CM

## 2017-04-13 DIAGNOSIS — R1032 Left lower quadrant pain: Secondary | ICD-10-CM

## 2017-04-13 DIAGNOSIS — B373 Candidiasis of vulva and vagina: Secondary | ICD-10-CM

## 2017-04-13 DIAGNOSIS — R51 Headache: Secondary | ICD-10-CM

## 2017-04-13 DIAGNOSIS — D573 Sickle-cell trait: Secondary | ICD-10-CM

## 2017-04-13 DIAGNOSIS — R1111 Vomiting without nausea: Secondary | ICD-10-CM

## 2017-04-13 DIAGNOSIS — R561 Post traumatic seizures: Secondary | ICD-10-CM

## 2017-04-13 DIAGNOSIS — Z9114 Patient's other noncompliance with medication regimen: Secondary | ICD-10-CM

## 2017-04-13 DIAGNOSIS — F3189 Other bipolar disorder: Secondary | ICD-10-CM

## 2017-04-13 MED ORDER — ACETAMINOPHEN 325 MG PO TABS
650 mg | Freq: Once | ORAL | Status: DC
Start: 2017-04-13 — End: 2017-04-14

## 2017-04-13 MED ORDER — ONDANSETRON HCL 4 MG/2ML IJ SOLN
4 mg | Freq: Once | INTRAVENOUS | Status: CP
Start: 2017-04-13 — End: ?

## 2017-04-13 MED ORDER — BOLUS IV FLUID JX
Freq: Once | INTRAVENOUS | Status: CP
Start: 2017-04-13 — End: ?

## 2017-04-13 MED ORDER — ACETAMINOPHEN 500 MG PO TABS
500 mg | Freq: Four times a day (QID) | ORAL | 0 refills | Status: CP | PRN
Start: 2017-04-13 — End: ?

## 2017-04-14 ENCOUNTER — Inpatient Hospital Stay: Admit: 2017-04-14 | Discharge: 2017-04-14

## 2017-04-14 ENCOUNTER — Emergency Department: Admit: 2017-04-14 | Discharge: 2017-04-14

## 2017-04-14 DIAGNOSIS — Z9119 Patient's noncompliance with other medical treatment and regimen: Secondary | ICD-10-CM

## 2017-04-14 DIAGNOSIS — D573 Sickle-cell trait: Secondary | ICD-10-CM

## 2017-04-14 DIAGNOSIS — R1110 Vomiting, unspecified: Secondary | ICD-10-CM

## 2017-04-14 DIAGNOSIS — Z79899 Other long term (current) drug therapy: Secondary | ICD-10-CM

## 2017-04-14 DIAGNOSIS — R197 Diarrhea, unspecified: Secondary | ICD-10-CM

## 2017-04-14 DIAGNOSIS — F319 Bipolar disorder, unspecified: Secondary | ICD-10-CM

## 2017-04-14 DIAGNOSIS — R561 Post traumatic seizures: Secondary | ICD-10-CM

## 2017-04-14 DIAGNOSIS — R1111 Vomiting without nausea: Secondary | ICD-10-CM

## 2017-04-14 DIAGNOSIS — J029 Acute pharyngitis, unspecified: Secondary | ICD-10-CM

## 2017-04-14 DIAGNOSIS — R6883 Chills (without fever): Secondary | ICD-10-CM

## 2017-04-14 DIAGNOSIS — B373 Candidiasis of vulva and vagina: Secondary | ICD-10-CM

## 2017-04-14 DIAGNOSIS — Z3202 Encounter for pregnancy test, result negative: Secondary | ICD-10-CM

## 2017-04-14 DIAGNOSIS — Z9114 Patient's other noncompliance with medication regimen: Secondary | ICD-10-CM

## 2017-04-14 DIAGNOSIS — R1084 Generalized abdominal pain: Secondary | ICD-10-CM

## 2017-04-14 DIAGNOSIS — R1032 Left lower quadrant pain: Secondary | ICD-10-CM

## 2017-04-14 DIAGNOSIS — R109 Unspecified abdominal pain: Principal | ICD-10-CM

## 2017-04-14 DIAGNOSIS — Z975 Presence of (intrauterine) contraceptive device: Secondary | ICD-10-CM

## 2017-04-14 DIAGNOSIS — L0100 Impetigo, unspecified: Secondary | ICD-10-CM

## 2017-04-14 DIAGNOSIS — R51 Headache: Secondary | ICD-10-CM

## 2017-04-14 DIAGNOSIS — N941 Unspecified dyspareunia: Secondary | ICD-10-CM

## 2017-04-14 DIAGNOSIS — F3189 Other bipolar disorder: Secondary | ICD-10-CM

## 2017-04-14 DIAGNOSIS — R112 Nausea with vomiting, unspecified: Secondary | ICD-10-CM

## 2017-04-14 DIAGNOSIS — F329 Major depressive disorder, single episode, unspecified: Secondary | ICD-10-CM

## 2017-04-14 MED ORDER — METRONIDAZOLE 500 MG PO TABS
2000 mg | Freq: Once | ORAL | Status: DC
Start: 2017-04-14 — End: 2017-04-14

## 2017-04-14 MED ORDER — METRONIDAZOLE 500 MG PO TABS
500 mg | Freq: Two times a day (BID) | ORAL | 0 refills | Status: CP
Start: 2017-04-14 — End: ?

## 2017-04-14 MED ORDER — AZITHROMYCIN 250 MG PO TABS
1000 mg | Freq: Once | ORAL | Status: CP
Start: 2017-04-14 — End: ?

## 2017-04-14 MED ORDER — ONDANSETRON 4 MG PO TBDP
4 mg | Freq: Three times a day (TID) | ORAL | 0 refills | Status: CP | PRN
Start: 2017-04-14 — End: ?

## 2017-04-14 MED ORDER — CEFTRIAXONE 1 G IV MBP
250 mg | Freq: Once | INTRAVENOUS | Status: CP
Start: 2017-04-14 — End: ?

## 2017-04-14 MED ORDER — MUPIROCIN 2 % EX OINT
0 refills | Status: CP
Start: 2017-04-14 — End: ?

## 2017-04-14 MED ORDER — ONDANSETRON 4 MG PO TBDP
4 mg | Freq: Once | ORAL | Status: CP
Start: 2017-04-14 — End: ?

## 2017-04-14 MED ORDER — METRONIDAZOLE 500 MG PO TABS
500 mg | Freq: Once | ORAL | Status: CP
Start: 2017-04-14 — End: ?

## 2017-04-14 NOTE — ED Provider Notes
Differential DDx: bacterial infection , viral infection , STD, UTI, PUD, ACS, arrythmia, polysubstance abuse, dehydration , gi bleed, ectopic, cyst, pid, appendicits, other     Is this an Emergent Medical Condition? Yes - Severe Pain/Acute Onset of Symptons  409.901 FS  641.19 FS  627.732 (16) FS    ED Workup   ED Ultrasound Performed  Date/Time: 04/14/2017 12:37 AM  Performed by: Rosilyn MingsIAZ, JOSEPH DANIEL  Authorized by: Rosilyn MingsIAZ, JOSEPH DANIEL   The ultrasound procedure performed was right upper quadrant ultrasound.   Reasons for the procedure performed included abdominal pain.   Gallbladder abnormal? no.  Gallbladder anterior wall thickness (mm): .22 cm.  Gallbladder stones noted? no.  Gallbladder wall thickened? no.  Pericholecystic fluid present? no.  Common bile duct enlarged? no.  Positive ultrasound Murphy's sign? no.  Technique: gallbladder evaluation in transverse and longitudinal axis.  Patient position: supine.  Documentation: images saved on hard disk and still images obtained.  Patient tolerance: Patient tolerated the procedure well with no immediate complications    Patient tolerance: Patient tolerated the procedure well with no immediate complications.    US reviewed with ED attending        Labs:  -   HEPATIC FUNCTION PANEL - Abnormal        Result Value Ref Range    Albumin 4.1  3.8 - 4.9 g/dL    Total Bilirubin 0.3  0.2 - 1.0 mg/dL    Bilirubin, Direct 0.1  0.0 - 0.2 mg/dL    Bilirubin, Indirect 0.2  8mg /dL mg/dL    Alkaline Phosphatase 56  35 - 104 IU/L    AST 20  14 - 33 IU/L    ALT 8 (*) 10 - 42 IU/L    Total Protein 6.8  6.5 - 8.3 g/dL    ALBUMIN/GLOBULIN RATIO 1.5  (calc)    Calc Total Globuin 2.7  gm/dL   CBC AUTODIFF - Abnormal     WBC 4.20 (*) 4.5 - 11 x10E3/uL    RBC 4.26  4.20 - 5.40 x10E6/uL    Hemoglobin 10.5 (*) 12.0 - 16.0 g/dL    Hematocrit 16.133.9 (*) 37.0 - 47.0 %    MCV 79.6 (*) 82.0 - 101.0 fl    MCH 24.6 (*) 27.0 - 34.0 pg    MCHC 31.0  31.0 - 36.0 g/dL    RDW 09.619.0 (*) 04.512.0 - 16.1 %

## 2017-04-14 NOTE — ED Provider Notes
has not followed up with obgyn   [JD]   0025 Hemoglobin baseline; no bleeding on exam , hemoccult negative, ceftriaxone IV, azithromycin,flagyl x 7 days ( first dose here )   [JD]      ED Course User Index  [JD] Rosilyn Mingsiaz, Joseph Daniel, MD      Flagyl as outopatient x 7 days , obgyn recheck within 48 hours, return to ED if syumptoms worsen or cannot take po at home with zofran odt, Patient stable at this time, NAD, tolerating PO, ambulating with steady based gait, VSS, informed of results, and counseled regarding appropriate follow-up plan at this point. Patient has been given strict return precautions and instructed to follow up on out-patient basis, but to return to ED if condition worsens or does not improve. Patient agrees to current plan and verbalizes their understanding of the instruction.       MDM   Decide to obtain history from someone other than the patient: No    Decide to obtain previous medical records: Yes - The review of the records selected below is documented in the ED Note : ( Prior ED visit )    Clinical Lab Test(s): Ordered and Reviewed    Diagnostic Tests (Radiology, EKG): Ordered and Reviewed    Independent Visualization (ED US, Wet Prep, Other): Yes - Documented in ED Provider Note    Discussed patient with NON-ED Provider: None      ED Disposition   ED Disposition: discharge    ED Clinical Impression   ED Clinical Impression:   Nausea and vomiting, intractability of vomiting not specified, unspecified vomiting type  Bipolar I disorder with depression  Non-adherence to medical treatment  Abdominal pain, unspecified abdominal location  Dyspareunia, female      ED Patient Status   Patient Status:   Good        ED Medical Evaluation Initiated   Medical Evaluation Initiated:  Yes, filed at 04/13/17 2223  by Rosilyn Mingsiaz, Joseph Daniel, MD            Rosilyn Mingsiaz, Joseph Daniel, MD  Resident  04/14/17 0040

## 2017-04-14 NOTE — ED Provider Notes
well-developed. No distress.   HENT:   Head: Atraumatic.   Mouth/Throat: No oropharyngeal exudate.   Eyes: Conjunctivae are normal. Right eye exhibits no discharge. Left eye exhibits no discharge.   Neck: Neck supple.   Cardiovascular: Normal rate and regular rhythm.    No murmur heard.  Pulmonary/Chest: Effort normal and breath sounds normal. No respiratory distress. She has no wheezes. She has no rales. She exhibits no tenderness.   Abdominal: Soft. She exhibits no distension. There is tenderness in the left lower quadrant. There is no rigidity, no rebound and no guarding.       Musculoskeletal: She exhibits no edema, tenderness or deformity.   Lymphadenopathy:     She has no cervical adenopathy.   Neurological: She is alert and oriented to person, place, and time.   Skin: Skin is warm and dry. Rash noted.   Dry skin and peeling behind right ear       Differential DDx: Gastritis, Diverticulitis, Pregnancy    Is this an Emergent Medical Condition? Yes - Severe Pain/Acute Onset of Symptons  409.901 FS  641.19 FS  627.732 (16) FS    ED Workup   Procedures    Labs:  -   POCT URINE PREGNANCY - Normal       Result Value Ref Range    Preg Test, Urine (POC) Negative  Negative   POCT URINE PREGNANCY         Imaging (Read by ED Provider):  not applicable      EKG (Read by ED Provider):  not applicable        ED Course & Re-Evaluation     ED Course      The patient was examined by myself and the medical student. She did not have her medications filled from yesterdays visit. We will repeat a pregnancy test, give ODT Zofran, and PO challenge. The case was discussed with Dr. Cleon GustinAshby. Earnest BaileyMichael Haynes, DO 7:50 AM 04/14/2017    The patient stated she will not fill her medications if they cost money since she cannot afford them. Earnest BaileyMichael Haynes, DO 7:53 AM 04/14/2017    Checked in on patient. She drank entire gatorade. Still complaining of abdominal pain. We are awaiting her to urinate for a POCT urine pregnancy

## 2017-04-14 NOTE — ED Provider Notes
bedtime. Reasons: Manic-Depression    ONDANSETRON (ZOFRAN-ODT) 4 MG PO TABLET DISINTEGRATING    Dissolve 1 tablet in mouth every 8 hours as needed for vomiting.    SERTRALINE (ZOLOFT) 50 MG PO TABLET    Take 1 tablet by mouth daily. Reasons: Depression   Modified Medications    No medications on file   Discontinued Medications    No medications on file       Past Medical History:   Diagnosis Date   ? Bipolar 1 disorder    ? Bipolar I disorder with depression 02/05/2017   ? Candidal vulvovaginitis 02/08/2017   ? Depression    ? Non-adherence to medical treatment 02/05/2017   ? Personal history of noncompliance with medical treatment, presenting hazards to health    ? Seizures, post-traumatic    ? Sickle cell trait        History reviewed. No pertinent surgical history.    No family history on file.    Social History     Social History   ? Marital status: Single     Spouse name: N/A   ? Number of children: N/A   ? Years of education: N/A     Social History Main Topics   ? Smoking status: Never Smoker   ? Smokeless tobacco: Never Used   ? Alcohol use No   ? Drug use: No   ? Sexual activity: Not Asked     Other Topics Concern   ? None     Social History Narrative   ? None       Review of Systems   Constitutional: Negative.    HENT: Negative.    Eyes: Negative.    Respiratory: Negative.    Cardiovascular: Negative.    Gastrointestinal: Positive for vomiting, abdominal pain and diarrhea.   Genitourinary: Negative.    Musculoskeletal: Negative.    Skin: Positive for rash.   Neurological: Negative.    Psychiatric/Behavioral: Negative.  Negative for suicidal ideas and depression.   Allergic/Immunologic: negative.    Endocrine: negative.       Physical Exam     ED Triage Vitals [04/14/17 0703]   BP 108/59   Pulse 75   Resp 18   Temp 36.4 ?C (97.6 ?F)   Temp src    Height 1.702 m   Weight 66.2 kg   SpO2 98 %   BMI (Calculated) 22.91             Physical Exam   Constitutional: She is oriented to person, place, and time. She appears

## 2017-04-14 NOTE — ED Provider Notes
arthralgias and gait problem.   Skin: Negative for color change, pallor, rash and wound.   Neurological: Negative for syncope, weakness, light-headedness and headaches.   Hematological: Negative for lymphadenopathy and easy bruising.     Psychiatric/Behavioral: Negative for confusion, self-injury and substance abuse.   Allergic/Immunologic: Negative for environmental allergies, food allergies, immunocompromised state and urticaria.   Endocrine: Negative for polydipsia and polyuria.   All other systems reviewed and are negative.      Physical Exam     ED Triage Vitals [04/13/17 2154]   BP 105/80   Pulse 65   Resp 16   Temp 36.9 ?C (98.5 ?F)   Temp src Oral   Height 1.702 m   Weight 65.8 kg   SpO2 100 %   BMI (Calculated) 22.76             Physical Exam   Constitutional: She is oriented to person, place, and time. She appears well-developed and well-nourished. No distress.   Patient standing in medical exam room   No acute distres  Smiling on exam   GCS15  AAOX4   HENT:   Head: Normocephalic and atraumatic.   Right Ear: External ear normal.   Left Ear: External ear normal.   Nose: Nose normal.   Mouth/Throat: Oropharyngeal exudate (white exudates at the right tonsil) present.   Eyes: Conjunctivae and EOM are normal. Pupils are equal, round, and reactive to light. Right eye exhibits no discharge. Left eye exhibits no discharge. No scleral icterus.   Neck: Normal range of motion. Neck supple. No JVD present. No tracheal deviation present. No thyromegaly present.   Cardiovascular: Normal rate, regular rhythm, normal heart sounds and intact distal pulses.  Exam reveals no gallop and no friction rub.    No murmur heard.  Pulmonary/Chest: Effort normal and breath sounds normal. No stridor. No respiratory distress. She has no wheezes. She has no rales. She exhibits no tenderness.   NEG CVA TTP BL    Abdominal: Soft. Normal appearance and bowel sounds are normal. She

## 2017-04-14 NOTE — ED Provider Notes
Provider Note    Discussed patient with NON-ED Provider: None      ED Disposition   ED Disposition: No ED Disposition Set      ED Clinical Impression   ED Clinical Impression:   Nausea and vomiting, intractability of vomiting not specified, unspecified vomiting type      ED Patient Status   Patient Status:   Good        ED Medical Evaluation Initiated   Medical Evaluation Initiated:  Yes, filed at 04/13/17 2223  by Rosilyn Mingsiaz, Joseph Daniel, MD

## 2017-04-14 NOTE — ED Provider Notes
History     Chief Complaint   Patient presents with   ? Nausea   ? Vomiting       Cheyenne Rhodes is an 21 year old female presenting to the ED with abdominal pain. This is the 4th time she has been seen since 04/01/17. She is complaining of headache, emesis, watery bowel movements, and increased tiredness for the last 4 days. She vomitted once since being seen yesterday. Her emesis this morning was yellow and watery. Yesterday she claims she had hemataemesis with clots which has since resolved. She is also having subjective. Her abdominal pain is LLQ and         Abdominal Pain       Allergies   Allergen Reactions   ? Chocolate Angioedema   ? Risperidone Other (See Comments)     "I dont really know, I just know I'm allergic"       Patient's Medications   New Prescriptions    No medications on file   Previous Medications    ACETAMINOPHEN (TYLENOL) 500 MG PO TABLET    Take 1 tablet by mouth every 6 hours as needed.    LITHIUM 300 MG PO CAPSULE    Take 1 capsule by mouth 2 times daily (with meals). Reasons: Manic-Depression    METRONIDAZOLE (FLAGYL) 500 MG PO TABLET    Take 1 tablet by mouth 2 times daily for 7 days.    OLANZAPINE (ZYPREXA) 10 MG PO TABLET    Take 1 tablet by mouth nightly at bedtime. Reasons: Manic-Depression    ONDANSETRON (ZOFRAN-ODT) 4 MG PO TABLET DISINTEGRATING    Dissolve 1 tablet in mouth every 8 hours as needed for vomiting.    SERTRALINE (ZOLOFT) 50 MG PO TABLET    Take 1 tablet by mouth daily. Reasons: Depression   Modified Medications    No medications on file   Discontinued Medications    No medications on file       Past Medical History:   Diagnosis Date   ? Bipolar 1 disorder    ? Bipolar I disorder with depression 02/05/2017   ? Candidal vulvovaginitis 02/08/2017   ? Depression    ? Non-adherence to medical treatment 02/05/2017   ? Personal history of noncompliance with medical treatment, presenting hazards to health    ? Seizures, post-traumatic    ? Sickle cell trait

## 2017-04-14 NOTE — ED Provider Notes
Neurological: Negative for syncope, weakness, light-headedness and headaches.   Hematological: Negative for lymphadenopathy and easy bruising.     Psychiatric/Behavioral: Negative for confusion, self-injury and substance abuse.   Allergic/Immunologic: Negative for environmental allergies, food allergies, immunocompromised state and urticaria.   Endocrine: Negative for polydipsia and polyuria.   All other systems reviewed and are negative.      Physical Exam     ED Triage Vitals [04/13/17 2154]   BP 105/80   Pulse 65   Resp 16   Temp 36.9 ?C (98.5 ?F)   Temp src Oral   Height 1.702 m   Weight 65.8 kg   SpO2 100 %   BMI (Calculated) 22.76             Physical Exam   Constitutional: She is oriented to person, place, and time. She appears well-developed and well-nourished. No distress.   Patient standing in medical exam room   No acute distres  Smiling on exam   GCS15  AAOX4   HENT:   Head: Normocephalic and atraumatic.   Right Ear: External ear normal.   Left Ear: External ear normal.   Nose: Nose normal.   Mouth/Throat: Oropharyngeal exudate (white exudates at the right tonsil) present.   Eyes: Conjunctivae and EOM are normal. Pupils are equal, round, and reactive to light. Right eye exhibits no discharge. Left eye exhibits no discharge. No scleral icterus.   Neck: Normal range of motion. Neck supple. No JVD present. No tracheal deviation present. No thyromegaly present.   Cardiovascular: Normal rate, regular rhythm, normal heart sounds and intact distal pulses.  Exam reveals no gallop and no friction rub.    No murmur heard.  Pulmonary/Chest: Effort normal and breath sounds normal. No stridor. No respiratory distress. She has no wheezes. She has no rales. She exhibits no tenderness.   NEG CVA TTP BL    Abdominal: Soft. Normal appearance and bowel sounds are normal. She exhibits no distension and no mass. There is tenderness (mild periumbilical and bilateral ( r and l ) lower quadrant ttp ]\) in the

## 2017-04-14 NOTE — ED Provider Notes
History     Chief Complaint   Patient presents with   ? Nausea   ? Vomiting       Cheyenne Rhodes is an 21 year old female presenting to the ED with abdominal pain. This is the 4th time she has been seen since 04/01/17. She is complaining of headache, emesis, watery bowel movements, and increased tiredness for the last 4 days. She vomitted once since being seen yesterday. Her emesis this morning was yellow and watery. Yesterday she claims she had hemataemesis with clots which has since resolved. She is also having subjective. Her abdominal pain is LLQ and gets minor relief from defecation. The patient was prescribed Zofran and Metronidazole yesterday and is not able to get either filled at this time.       The history is provided by the patient. No language interpreter was used.   Abdominal Pain   Pain location:  LLQ  Pain quality: cramping    Pain radiates to:  LLQ  Pain severity:  Mild  Onset quality:  Gradual  Duration:  4 days  Timing:  Intermittent  Progression:  Worsening  Chronicity:  New  Relieved by:  Bowel activity  Worsened by:  Nothing  Ineffective treatments:  None tried  Associated symptoms: diarrhea and vomiting    Diarrhea:     Quality:  Semi-solid    Number of occurrences:  1    Severity:  Mild  Risk factors: NSAID use        Allergies   Allergen Reactions   ? Chocolate Angioedema   ? Risperidone Other (See Comments)     "I dont really know, I just know I'm allergic"       Patient's Medications   New Prescriptions    MUPIROCIN (BACTROBAN) 2 % EX OINTMENT    Apply a small amount to ear three times a day   Previous Medications    ACETAMINOPHEN (TYLENOL) 500 MG PO TABLET    Take 1 tablet by mouth every 6 hours as needed.    LITHIUM 300 MG PO CAPSULE    Take 1 capsule by mouth 2 times daily (with meals). Reasons: Manic-Depression    METRONIDAZOLE (FLAGYL) 500 MG PO TABLET    Take 1 tablet by mouth 2 times daily for 7 days.    OLANZAPINE (ZYPREXA) 10 MG PO TABLET    Take 1 tablet by mouth nightly at

## 2017-04-14 NOTE — ED Provider Notes
Platelet Count 220  140 - 440 thou/cu mm    MPV 11.5  9.5 - 11.5 fl    nRBC % 0.0  0.0 - 1.0 %    Absolute NRBC Count 0.00     MANUAL DIFF JAX - Abnormal     Neutrophil % 36  %    Bands % 0  %    Lymphs % 47  %    Monocytes % 6  2 - 6 %    Eos % 4  1 - 4 %    Basos % 3 (*) 0 - 1 %    Metamyelocytes % 0  %    Myelocytes % 0  %    Promyelocytes % 0  %    Atypical Lymphocytes % 3.5  %    Neutrophil Abs 1.52 (*) 1.8 - 8.7 x10E3/uL    Lymphs Abs 2.12  thou/ cu mm    Monos Abs 0.26  x10E3/uL    Eos Abs 0.18  thou/ cu mm    Basos Abs 0.11      Immature Granulocytes Absolute 0.00  <=0.00 x10E3/uL   POCT URINALYSIS AUTO W/O MICROSCOPY - Abnormal     Color -Ur Yellow      Clarity, UA Cloudy      Spec Grav 1.030  1.003 - 1.030    pH 5.5 (*) 7.4 - 7.4    Urobilinogen -Ur 4.0 (*) <=2.0 E.U./dL    Nitrite -Ur Negative  Negative    Protein-UA Trace (*) Negative mg/dL    Glucose -Ur Negative  Negative mg/dL    Blood, UA Negative  Negative    WBC, UA Negative  Negative    Bilirubin -Ur Small (*) Negative    Ketones -UR Trace (*) Negative   LIPASE - Normal    Lipase 34  0 - 60 U/L   POCT URINE PREGNANCY - Normal    Preg Test, Urine (POC) Negative  Negative   CHLAMYDIA/ GONORRHEA DETECTIO*   BASIC METABOLIC PANEL    Sodium 852  135 - 145 mmol/L    Potassium 4.1  3.3 - 4.6 mmol/L    Chloride 104  101 - 110 mmol/L    CO2 22  21 - 29 mmol/L    Urea Nitrogen 15  6 - 22 mg/dL    Creatinine 0.76  0.51 - 0.95 mg/dL    BUN/Creatinine Ratio 19.7  6.0 - 22.0 (calc)    Glucose 76  71 - 99 mg/dL    Calcium 9.3  8.6 - 10.0 mg/dL    Osmolality Calc 279.0      Anion Gap 14  4 - 16 mmol/L    EGFR >59  mL/min/1.73M2    Comment:   Reference range: =>90 ml/min/1.73M2  eGFR estimates are unable to accurately differentiate levels of GFR above 60 ml/min/1.73M2.   MORPHOLOGY JAX    Platelet Estimate Normal      Anisocytosis Slight      Microcytes Slight      Hypochromia Slight      Polychromasia Slight      Ovalocytes Occasional

## 2017-04-14 NOTE — ED Provider Notes
History reviewed. No pertinent surgical history.    No family history on file.    Social History     Social History   ? Marital status: Single     Spouse name: N/A   ? Number of children: N/A   ? Years of education: N/A     Social History Main Topics   ? Smoking status: Never Smoker   ? Smokeless tobacco: Never Used   ? Alcohol use No   ? Drug use: No   ? Sexual activity: Not Asked     Other Topics Concern   ? None     Social History Narrative   ? None       Review of Systems   Gastrointestinal: Positive for abdominal pain.       Physical Exam     ED Triage Vitals [04/14/17 0703]   BP 108/59   Pulse 75   Resp 18   Temp 36.4 ?C (97.6 ?F)   Temp src    Height 1.702 m   Weight 66.2 kg   SpO2 98 %   BMI (Calculated) 22.91             Physical Exam    Differential DDx: ***    Is this an Emergent Medical Condition? {SH ED EMERGENT MEDICAL CONDITION:[id number]}  409.901 FS  641.19 FS  627.732 (16) FS    ED Workup   Procedures    Labs:  - - No data to display      Imaging (Read by ED Provider):  {Imaging findings:[id number]}      EKG (Read by ED Provider):  {EKG findings:[id number]}        ED Course & Re-Evaluation     ED Course          MDM   Decide to obtain history from someone other than the patient: {SH ED Lamonte SakaiJX MDM - OBTAIN ZOXWRUE:45409}HISTORY:28378}    Decide to obtain previous medical records: [facility]{SH ED Lamonte SakaiJX MDM - PREVIOUS MED REC - NO WJX:91478}YES:28380}    Clinical Lab Test(s): {SH ED Lamonte SakaiJX MDM ORDERED AND REVIEWED:28124}    Diagnostic Tests (Radiology, EKG): {SH ED Lamonte SakaiJX MDM ORDERED AND REVIEWED:28124}    Independent Visualization (ED US, Wet Prep, Other): {SH ED Lamonte SakaiJX MDM NO YES GNFAOZHY:86578}WILDCARD:26444}    Discussed patient with NON-ED Provider: {SH ED Lamonte SakaiJX MDM - ANOTHER PROVIDER:28381}      ED Disposition   ED Disposition: No ED Disposition Set      ED Clinical Impression   ED Clinical Impression:   No Clinical Impression Set      ED Patient Status   Patient Status:   {SH ED JX PATIENT STATUS:[phone]}        ED Medical Evaluation Initiated

## 2017-04-14 NOTE — ED Provider Notes
Head: Atraumatic.   Mouth/Throat: No oropharyngeal exudate.   Eyes: Conjunctivae are normal. Right eye exhibits no discharge. Left eye exhibits no discharge.   Neck: Neck supple.   Cardiovascular: Normal rate and regular rhythm.    No murmur heard.  Pulmonary/Chest: Effort normal and breath sounds normal. No respiratory distress. She has no wheezes. She has no rales. She exhibits no tenderness.   Abdominal: Soft. She exhibits no distension. There is tenderness in the left lower quadrant. There is no rigidity, no rebound and no guarding.       Musculoskeletal: She exhibits no edema, tenderness or deformity.   Lymphadenopathy:     She has no cervical adenopathy.   Neurological: She is alert and oriented to person, place, and time.   Skin: Skin is warm and dry. Rash noted.   Dry skin and peeling behind right ear       Differential DDx: Gastritis, Diverticulitis, Pregnancy    Is this an Emergent Medical Condition? Yes - Severe Pain/Acute Onset of Symptons  409.901 FS  641.19 FS  627.732 (16) FS    ED Workup   Procedures    Labs:  - - No data to display      Imaging (Read by ED Provider):  not applicable      EKG (Read by ED Provider):  not applicable        ED Course & Re-Evaluation     ED Course      The patient was examined by myself and the medical student. She did not have her medications filled from yesterdays visit. We will repeat a pregnancy test, give ODT Zofran, and PO challenge. The case was discussed with Dr. Cleon GustinAshby. Earnest BaileyMichael Haynes, DO 7:50 AM 04/14/2017    The patient stated she will not fill her medications if they cost money since she cannot afford them. Earnest BaileyMichael Haynes, DO 7:53 AM 04/14/2017    MDM   Decide to obtain history from someone other than the patient: No    Decide to obtain previous medical records: Yes - The review of the records selected below is documented in the ED Note : ( Prior ED visit )    Clinical Lab Test(s): Ordered and Reviewed

## 2017-04-14 NOTE — ED Provider Notes
ONDANSETRON (ZOFRAN-ODT) 4 MG PO TABLET DISINTEGRATING    Dissolve 1 tablet in mouth every 8 hours as needed for vomiting.    SERTRALINE (ZOLOFT) 50 MG PO TABLET    Take 1 tablet by mouth daily. Reasons: Depression   Modified Medications    No medications on file   Discontinued Medications    No medications on file       Past Medical History:   Diagnosis Date   ? Bipolar 1 disorder    ? Bipolar I disorder with depression 02/05/2017   ? Candidal vulvovaginitis 02/08/2017   ? Depression    ? Non-adherence to medical treatment 02/05/2017   ? Personal history of noncompliance with medical treatment, presenting hazards to health    ? Seizures, post-traumatic    ? Sickle cell trait        History reviewed. No pertinent surgical history.    No family history on file.    Social History     Social History   ? Marital status: Single     Spouse name: N/A   ? Number of children: N/A   ? Years of education: N/A     Social History Main Topics   ? Smoking status: Never Smoker   ? Smokeless tobacco: Never Used   ? Alcohol use No   ? Drug use: No   ? Sexual activity: Not Asked     Other Topics Concern   ? None     Social History Narrative   ? None       Review of Systems   Constitutional: Negative.    HENT: Negative.    Eyes: Negative.    Respiratory: Negative.    Cardiovascular: Negative.    Gastrointestinal: Positive for vomiting, abdominal pain and diarrhea.   Genitourinary: Negative.    Musculoskeletal: Negative.    Skin: Negative.    Neurological: Negative.    Psychiatric/Behavioral: Negative.  Negative for suicidal ideas and depression.   Allergic/Immunologic: negative.    Endocrine: negative.       Physical Exam     ED Triage Vitals [04/14/17 0703]   BP 108/59   Pulse 75   Resp 18   Temp 36.4 ?C (97.6 ?F)   Temp src    Height 1.702 m   Weight 66.2 kg   SpO2 98 %   BMI (Calculated) 22.91             Physical Exam   Constitutional: She appears well-developed. No distress.   HENT:   Head: Atraumatic.

## 2017-04-14 NOTE — ED Provider Notes
Labs:  - - No data to display      Imaging (Read by ED Provider):  .ris      EKG (Read by ED Provider):  ***        ED Course & Re-Evaluation     ED Course as of Apr 14 30   Thu Apr 13, 2017   2300 Pending BEDISDE POCUS RUQ SCAN , PELIC, AND RECTAL EXAM ,when chapronme is available, tylenol for pain control now, zofran for nausea / emesis control , no emeisi yetin ED , VS stable  [JD]   2322 Specific Gravity -Ur: 1.030 [JD]   2323 Signs of dehydration on urine, 1 L ns running, vs wnl  [JD]   2352 Urine Pregnancy Test: Negative [JD]   2356  Primary Coverage:  MED[place] [place] [place] - MEDICAID [place]      contacted clinical care coordinator to arrange followup care    followup with PCP and obgyn   ed as needed   [JD]   2357 Patient pending IV placement , blood collection, pelvic and rectal exam.  RN staff aware of plan with clsoed loop communication  [JD]   Fri Apr 14, 2017   0002 Patient now is requesting to go to the bathroom to urinate before her pelvic exam ; walking with steady based gait  [JD]   0011 Call back number [phone] confirmed with patietn at bedside    per chart review patient has been seen for similar presetnations in the ED    02/10/2017  02/15/2017  prior docuemntation of IUD , which patient did not disclose during this visit on intial interview    has not followed up with obgyn   [JD]   0025 Hemoglobin baseline; no bleeding on exam , hemoccult negative, ceftriaxone IV, azithromycin,flagyl x 7 days ( first dose here )   [JD]      ED Course User Index  [JD] Rosilyn Mingsiaz, Joseph Daniel, MD         MDM   Decide to obtain history from someone other than the patient: No    Decide to obtain previous medical records: Yes - The review of the records selected below is documented in the ED Note : ( Prior ED visit )    Clinical Lab Test(s): Ordered and Reviewed    Diagnostic Tests (Radiology, EKG): Ordered and Reviewed    Independent Visualization (ED US, Wet Prep, Other): Yes - Documented in ED

## 2017-04-14 NOTE — ED Notes
Time of discharge: 921  AM., Patient discharged to  Home.  Patient discharged  ambulatory. to exit with belongings in  Stable condition.  Patient escorted by  no one., Written discharge instructions given to  patient.  Patient/recipient  verbalizes discharge instructions. Discussed at home symptom and pain mgmt.

## 2017-04-14 NOTE — ED Provider Notes
2356  Primary Coverage:  MEDICAID [place] [employer] - MEDICAID [place]INE      contacted clinical care coordinator to arrange followup care    followup with PCP and obgyn   ed as needed   [JD]   2357 Patient pending IV placement , blood collection, pelvic and rectal exam.  RN staff aware of plan with clsoed loop communication  [JD]   Fri Apr 14, 2017   0002 Patient now is requesting to go to the bathroom to urinate before her pelvic exam ; walking with steady based gait  [JD]   0011 Call back number [phone] confirmed with patietn at bedside    per chart review patient has been seen for similar presetnations in the ED    02/10/2017  02/15/2017  prior docuemntation of IUD , which patient did not disclose during this visit on intial interview    has not followed up with obgyn   [JD]   0025 Hemoglobin baseline; no bleeding on exam , hemoccult negative, ceftriaxone IV, azithromycin,flagyl x 7 days ( first dose here )   [JD]      ED Course User Index  [JD] Rosilyn Mingsiaz, Joseph Daniel, MD         MDM   Decide to obtain history from someone other than the patient: No    Decide to obtain previous medical records: Yes - The review of the records selected below is documented in the ED Note : ( Prior ED visit )    Clinical Lab Test(s): Ordered and Reviewed    Diagnostic Tests (Radiology, EKG): Ordered and Reviewed    Independent Visualization (ED US, Wet Prep, Other): Yes - Documented in ED Provider Note    Discussed patient with NON-ED Provider: None      ED Disposition   ED Disposition: No ED Disposition Set      ED Clinical Impression   ED Clinical Impression:   Nausea and vomiting, intractability of vomiting not specified, unspecified vomiting type      ED Patient Status   Patient Status:   Good        ED Medical Evaluation Initiated   Medical Evaluation Initiated:  Yes, filed at 04/13/17 2223  by Rosilyn Mingsiaz, Joseph Daniel, MD

## 2017-04-14 NOTE — ED Triage Notes
Pt ambulates with steady gait to triage with c/o vomiting blood x 3 days. Pt reports vomit as dark. Pt replies "yes" when asked if vomit looks like coffee grinds. Pt also reports diarrhea x 2 days ago and reports that it was watery and dark. Pt also reports twicthing. No tremors or twitching noted at this time. Pt denies CP, SOB, fevers, and chills. Pt states she has been very tired. NAD noted at this time.

## 2017-04-14 NOTE — ED Attestation Note
Attestation   I saw and evaluated the patient. I reviewed and agree with the findings and plan as documented in the note. I reviewed the patient's history. I have made corrections and additions as appropriate. I discussed this patient with the resident/fellow.       I  have reviewed the images with the resident during the limited biliary and cardiac ultrasound examination as indicated for abdominal pain. The ultrasound imaging protocol was performed as listed in the resident procedure note. I have discussed the findings with the resident and agree with the findings documented in the resident's note.  The final interpretation of those findings is/are biliary No gallstones or thickened GB wall and cardiac no sonographic evidence of volume depletion.       Ronaldo Miyamotooe, Edward J III, MD  04/14/17 [id number]       Ronaldo Miyamotooe, Edward J III, MD  04/14/17 [phone]

## 2017-04-14 NOTE — ED Provider Notes
respiratory distress. She has no wheezes. She has no rales. She exhibits no tenderness.   NEG CVA TTP BL    Abdominal: Soft. Normal appearance and bowel sounds are normal. She exhibits no distension and no mass. There is tenderness (mild periumbilical and bilateral ( r and l ) lower quadrant ttp ]\) in the right lower quadrant, periumbilical area, suprapubic area and left lower quadrant. There is no rigidity, no rebound, no guarding, no CVA tenderness, no tenderness at McBurney's point and negative Murphy's sign. No hernia.   Genitourinary: Rectal exam shows guaiac negative stool. Vaginal discharge found.   Genitourinary Comments: Rectal Exam:   Patient verbally consented to undergo rectal examination   Exam performed with chaperone RN .   Perirectal lesions or fissures: no  External sphincter tone intact: yes   Rectal vault without masses: yes  Stool LIGHT BROWN   Hemoccult NEGATIVE  Core Temp 98.66F      Pelvic Exam:   Patient verbally consented to undergo pelvic exam  Exam performed with chaperone, RN.   External genitalia without lesions, or masses    Vaginal mucosa pink  Vaginal Vault with Discharge  Vaginal Vault without Blood  Cervix pink and non-friable, os closed  no cervical motion tenderness  no adnexal tenderness to palpation  Uterus smooth, no masses palpated  Wet prep shows NO trich, YES clue cells, NO candida   yes GC/CL Culture Swab collected     Musculoskeletal: Normal range of motion. She exhibits no edema, tenderness or deformity.   Lymphadenopathy:     She has no cervical adenopathy.   Neurological: She is alert and oriented to person, place, and time. No cranial nerve deficit.   Skin: Skin is warm and dry. No rash noted. She is not diaphoretic. No erythema. No pallor.   Psychiatric: She has a normal mood and affect. Her behavior is normal. Judgment and thought content normal.   DENIES SI AND HI     Nursing note and vitals reviewed.

## 2017-04-14 NOTE — ED Provider Notes
History     Chief Complaint   Patient presents with   ? Nausea   ? Vomiting       Leafy Halfmaris is an 21 year old female presenting to the ED with abdominal pain. This is the 4th time she has been seen since 04/01/17. She is complaining of headache, emesis, watery bowel movements, and increased tiredness for the last 4 days. She vomitted once since being seen yesterday. Her emesis this morning was yellow and watery. Yesterday she claims she had hemataemesis with clots which has since resolved. She is also having subjective. Her abdominal pain is LLQ and gets minor relief from defecation. The patient was prescribed Zofran and Metronidazole yesterday and is not able to get either filled at this time.       The history is provided by the patient. No language interpreter was used.   Abdominal Pain   Pain location:  LLQ  Pain quality: cramping    Pain radiates to:  LLQ  Pain severity:  Mild  Onset quality:  Gradual  Duration:  4 days  Timing:  Intermittent  Progression:  Worsening  Chronicity:  New  Relieved by:  Bowel activity  Worsened by:  Nothing  Ineffective treatments:  None tried  Associated symptoms: diarrhea and vomiting    Diarrhea:     Quality:  Semi-solid    Number of occurrences:  1    Severity:  Mild  Risk factors: NSAID use        Allergies   Allergen Reactions   ? Chocolate Angioedema   ? Risperidone Other (See Comments)     "I dont really know, I just know I'm allergic"       Patient's Medications   New Prescriptions    No medications on file   Previous Medications    ACETAMINOPHEN (TYLENOL) 500 MG PO TABLET    Take 1 tablet by mouth every 6 hours as needed.    LITHIUM 300 MG PO CAPSULE    Take 1 capsule by mouth 2 times daily (with meals). Reasons: Manic-Depression    METRONIDAZOLE (FLAGYL) 500 MG PO TABLET    Take 1 tablet by mouth 2 times daily for 7 days.    OLANZAPINE (ZYPREXA) 10 MG PO TABLET    Take 1 tablet by mouth nightly at bedtime. Reasons: Manic-Depression

## 2017-04-14 NOTE — ED Provider Notes
LITHIUM 300 MG PO CAPSULE    Take 1 capsule by mouth 2 times daily (with meals). Reasons: Manic-Depression    OLANZAPINE (ZYPREXA) 10 MG PO TABLET    Take 1 tablet by mouth nightly at bedtime. Reasons: Manic-Depression    SERTRALINE (ZOLOFT) 50 MG PO TABLET    Take 1 tablet by mouth daily. Reasons: Depression   Modified Medications    No medications on file   Discontinued Medications    No medications on file       Past Medical History:   Diagnosis Date   ? Bipolar 1 disorder    ? Bipolar I disorder with depression 02/05/2017   ? Candidal vulvovaginitis 02/08/2017   ? Depression    ? Non-adherence to medical treatment 02/05/2017   ? Personal history of noncompliance with medical treatment, presenting hazards to health    ? Seizures, post-traumatic    ? Sickle cell trait        History reviewed. No pertinent surgical history.    History reviewed. No pertinent family history.    Social History     Social History   ? Marital status: Single     Spouse name: N/A   ? Number of children: N/A   ? Years of education: N/A     Social History Main Topics   ? Smoking status: Never Smoker   ? Smokeless tobacco: Never Used   ? Alcohol use No   ? Drug use: No   ? Sexual activity: Not Asked     Other Topics Concern   ? None     Social History Narrative   ? None       Review of Systems   Constitutional: Positive for chills. Negative for fever, diaphoresis, activity change, appetite change, fatigue and unexpected weight change.   HENT: Positive for sore throat. Negative for ear pain, trouble swallowing, neck pain and voice change.    Eyes: Negative for pain, redness and visual disturbance.   Respiratory: Negative for cough and shortness of breath.    Cardiovascular: Negative for chest pain, palpitations and leg swelling.   Gastrointestinal: Positive for nausea, vomiting, abdominal pain and diarrhea. Negative for constipation, blood in stool, anal bleeding and rectal pain.   Genitourinary: Positive for dysuria, vaginal pain and dyspareunia.

## 2017-04-14 NOTE — ED Provider Notes
Mouth/Throat: No oropharyngeal exudate.       Differential DDx: ***    Is this an Emergent Medical Condition? {SH ED EMERGENT MEDICAL CONDITION:[id number]}  409.901 FS  641.19 FS  627.732 (16) FS    ED Workup   Procedures    Labs:  - - No data to display      Imaging (Read by ED Provider):  {Imaging findings:[id number]}      EKG (Read by ED Provider):  {EKG findings:[id number]}        ED Course & Re-Evaluation     ED Course          MDM   Decide to obtain history from someone other than the patient: {SH ED Lamonte SakaiJX MDM - OBTAIN VHQIONG:29528}HISTORY:28378}    Decide to obtain previous medical records: [facility]{SH ED Lamonte SakaiJX MDM - PREVIOUS MED REC - NO UXL:24401}YES:28380}    Clinical Lab Test(s): {SH ED Lamonte SakaiJX MDM ORDERED AND REVIEWED:28124}    Diagnostic Tests (Radiology, EKG): {SH ED Lamonte SakaiJX MDM ORDERED AND REVIEWED:28124}    Independent Visualization (ED US, Wet Prep, Other): {SH ED Lamonte SakaiJX MDM NO YES [id number]:44034}WILDCARD:26444}    Discussed patient with NON-ED Provider: {SH ED Lamonte SakaiJX MDM - ANOTHER PROVIDER:28381}      ED Disposition   ED Disposition: No ED Disposition Set      ED Clinical Impression   ED Clinical Impression:   No Clinical Impression Set      ED Patient Status   Patient Status:   [phone]}        ED Medical Evaluation Initiated   Medical Evaluation Initiated:  Yes, filed at 04/14/17 0717  by Earnest BaileyHaynes, Michael, DO

## 2017-04-14 NOTE — ED Provider Notes
Diagnostic Tests (Radiology, EKG): {SH ED Lamonte SakaiJX MDM ORDERED AND REVIEWED:28124}    Independent Visualization (ED US, Wet Prep, Other): {SH ED Lamonte SakaiJX MDM NO YES [id number]:47829}WILDCARD:26444}    Discussed patient with NON-ED Provider: {SH ED Lamonte SakaiJX MDM - ANOTHER PROVIDER:28381}      ED Disposition   ED Disposition: No ED Disposition Set      ED Clinical Impression   ED Clinical Impression:   Generalized abdominal pain  Non compliance with medical treatment  Vomiting, intractability of vomiting not specified, presence of nausea not specified, unspecified vomiting type      ED Patient Status   Patient Status:   Good        ED Medical Evaluation Initiated   Medical Evaluation Initiated:  Yes, filed at 04/14/17 0717  by Earnest BaileyHaynes, Michael, DO

## 2017-04-14 NOTE — ED Provider Notes
Decide to obtain previous medical records: Yes - The review of the records selected below is documented in the ED Note : ( Prior ED visit )    Clinical Lab Test(s): Ordered and Reviewed    Diagnostic Tests (Radiology, EKG): {SH ED Lamonte SakaiJX MDM ORDERED AND REVIEWED:28124}    Independent Visualization (ED US, Wet Prep, Other): {SH ED Lamonte SakaiJX MDM NO YES ZOXWRUEA:54098}WILDCARD:26444}    Discussed patient with NON-ED Provider: {SH ED Lamonte SakaiJX MDM - ANOTHER PROVIDER:28381}      ED Disposition   ED Disposition: No ED Disposition Set      ED Clinical Impression   ED Clinical Impression:   Generalized abdominal pain  Non compliance with medical treatment  Vomiting, intractability of vomiting not specified, presence of nausea not specified, unspecified vomiting type      ED Patient Status   Patient Status:   Good        ED Medical Evaluation Initiated   Medical Evaluation Initiated:  Yes, filed at 04/14/17 0717  by Earnest BaileyHaynes, Michael, DO

## 2017-04-14 NOTE — ED Provider Notes
right lower quadrant, periumbilical area, suprapubic area and left lower quadrant. There is no rigidity, no rebound, no guarding, no CVA tenderness, no tenderness at McBurney's point and negative Murphy's sign. No hernia.   Genitourinary:   Genitourinary Comments: Rectal Exam:   Patient verbally consented to undergo rectal examination   Exam performed with chaperone ***.   Perirectal lesions or fissures: {yes no:25125}  External sphincter tone intact: {yes no:25125}   Rectal vault without masses: {yes no:25125}  Stool ***  Hemoccult ***  Core Temp ***      Pelvic Exam:   Patient verbally consented to undergo pelvic exam  Exam performed with chaperone, ***.   External genitalia {with/without:832 341 2547} lesions, or masses    Vaginal mucosa pink  Vaginal Vault {with/without:832 341 2547} Discharge  Vaginal Vault {with/without:832 341 2547} Blood  Cervix pink and non-friable, os closed  {yes no:25125} cervical motion tenderness  {yes no:25125} adnexal tenderness to palpation  Uterus smooth, no masses palpated  Wet prep shows *** trich, clue cells, or candida   {yes no:25125} GC/CL Culture Swab collected     Musculoskeletal: Normal range of motion. She exhibits no edema, tenderness or deformity.   Lymphadenopathy:     She has no cervical adenopathy.   Neurological: She is alert and oriented to person, place, and time. No cranial nerve deficit.   Skin: Skin is warm and dry. No rash noted. She is not diaphoretic. No erythema. No pallor.   Psychiatric: She has a normal mood and affect. Her behavior is normal. Judgment and thought content normal.   DENIES SI AND HI     Nursing note and vitals reviewed.      Differential DDx: bacterial infection , viral infection , STD, UTI, PUD, ACS, arrythmia, polysubstance abuse, dehydration , gi bleed, ectopic, cyst, pid, appendicits, other     Is this an Emergent Medical Condition? Yes - Severe Pain/Acute Onset of Symptons  409.901 FS  641.19 FS  627.732 (16) FS    ED Workup   Procedures

## 2017-04-14 NOTE — ED Notes
IV removed with catheter intact.  Pressure applied, Time of discharge: 0152  AM., Patient discharged to  Home.  Patient discharged  ambulatory. to exit with belongings in  Stable condition.  Patient escorted by  no one., Written discharge instructions given to  patient.  Patient/recipient  verbalizes discharge instructions.

## 2017-04-14 NOTE — ED Provider Notes
Head: Atraumatic.   Mouth/Throat: No oropharyngeal exudate.   Eyes: Conjunctivae are normal. Right eye exhibits no discharge. Left eye exhibits no discharge.   Neck: Neck supple.   Cardiovascular: Normal rate and regular rhythm.    No murmur heard.  Pulmonary/Chest: Effort normal and breath sounds normal. No respiratory distress. She has no wheezes. She has no rales. She exhibits no tenderness.   Abdominal: Soft. She exhibits no distension. There is tenderness in the left lower quadrant. There is no rigidity, no rebound and no guarding.       Musculoskeletal: She exhibits no edema, tenderness or deformity.   Lymphadenopathy:     She has no cervical adenopathy.   Neurological: She is alert and oriented to person, place, and time.   Skin: Skin is warm and dry. Rash noted.   Dry skin and peeling behind right ear       Differential DDx: Gastritis, Diverticulitis, Pregnancy    Is this an Emergent Medical Condition? Yes - Severe Pain/Acute Onset of Symptons  409.901 FS  641.19 FS  627.732 (16) FS    ED Workup   Procedures    Labs:  - - No data to display      Imaging (Read by ED Provider):  not applicable      EKG (Read by ED Provider):  not applicable        ED Course & Re-Evaluation     ED Course      The patient was examined by myself and the medical student. She did not have her medications filled from yesterdays visit. We will repeat a pregnancy test, give ODT Zofran, and PO challenge. The case was discussed with Dr. Cleon GustinAshby. Earnest BaileyMichael Haynes, DO 7:50 AM 04/14/2017    The patient stated she will not fill her medications if they cost money since she cannot afford them. Earnest BaileyMichael Haynes, DO 7:53 AM 04/14/2017    Checked in on patient. She drank entire gatorade. Still complaining of abdominal pain. We are awaiting her to urinate for a POCT urine pregnancy test. Earnest BaileyMichael Haynes, DO 8:23 AM 04/14/2017    MDM   Decide to obtain history from someone other than the patient: No

## 2017-04-14 NOTE — ED Provider Notes
has not followed up with obgyn   [JD]   0025 Hemoglobin baseline; no bleeding on exam , hemoccult negative, ceftriaxone IV, azithromycin,flagyl x 7 days ( first dose here ) ; serial abodminal exam benign no emesis here  [JD]   0059 Serial abdominal exam benign,  labs unremarkable, no adnexal ttp , nontender pelvic exam   [JD]      ED Course User Index  [JD] Rosilyn Mingsiaz, Joseph Daniel, MD      Flagyl as outopatient x 7 days , obgyn recheck within 48 hours, return to ED if syumptoms worsen or cannot take po at home with zofran odt, Patient stable at this time, NAD, tolerating PO, ambulating with steady based gait, VSS, informed of results, and counseled regarding appropriate follow-up plan at this point. Patient has been given strict return precautions and instructed to follow up on out-patient basis, but to return to ED if condition worsens or does not improve. Patient agrees to current plan and verbalizes their understanding of the instruction.       MDM   Decide to obtain history from someone other than the patient: No    Decide to obtain previous medical records: Yes - The review of the records selected below is documented in the ED Note : ( Prior ED visit )    Clinical Lab Test(s): Ordered and Reviewed    Diagnostic Tests (Radiology, EKG): Ordered and Reviewed    Independent Visualization (ED US, Wet Prep, Other): Yes - Documented in ED Provider Note    Discussed patient with NON-ED Provider: None      ED Disposition   ED Disposition: discharge    ED Clinical Impression   ED Clinical Impression:   Nausea and vomiting, intractability of vomiting not specified, unspecified vomiting type  Bipolar I disorder with depression  Non-adherence to medical treatment  Abdominal pain, unspecified abdominal location  Dyspareunia, female      ED Patient Status   Patient Status:   Good        ED Medical Evaluation Initiated   Medical Evaluation Initiated:  Yes, filed at 04/13/17 2223  by Rosilyn Mingsiaz, Joseph Daniel, MD

## 2017-04-14 NOTE — ED Triage Notes
Pt arrived ambulatory to triage waiting room with a c/c of N/V/D X3 days. Pt stated she was seen yesterday and sent home with RX that she did not get a chance to fill. Pt denies and chest pain. Stated feeling chills but denies fever. Pt to PEDS for further eval.

## 2017-04-14 NOTE — ED Provider Notes
Negative for frequency, hematuria, flank pain, decreased urine volume, vaginal bleeding, vaginal discharge and difficulty urinating.   Musculoskeletal: Negative for myalgias, back pain, joint swelling, arthralgias and gait problem.   Skin: Negative for color change, pallor, rash and wound.   Neurological: Negative for syncope, weakness, light-headedness and headaches.   Hematological: Negative for lymphadenopathy and easy bruising.     Psychiatric/Behavioral: Negative for confusion, self-injury and substance abuse.   Allergic/Immunologic: Negative for environmental allergies, food allergies, immunocompromised state and urticaria.   Endocrine: Negative for polydipsia and polyuria.   All other systems reviewed and are negative.      Physical Exam     ED Triage Vitals [04/13/17 2154]   BP 105/80   Pulse 65   Resp 16   Temp 36.9 ?C (98.5 ?F)   Temp src Oral   Height 1.702 m   Weight 65.8 kg   SpO2 100 %   BMI (Calculated) 22.76             Physical Exam   Constitutional: She is oriented to person, place, and time. She appears well-developed and well-nourished. No distress.   Patient standing in medical exam room   No acute distres  Smiling on exam   GCS15  AAOX4   HENT:   Head: Normocephalic and atraumatic.   Right Ear: External ear normal.   Left Ear: External ear normal.   Nose: Nose normal.   Mouth/Throat: Oropharyngeal exudate (white exudates at the right tonsil) present.   Eyes: Conjunctivae and EOM are normal. Pupils are equal, round, and reactive to light. Right eye exhibits no discharge. Left eye exhibits no discharge. No scleral icterus.   Neck: Normal range of motion. Neck supple. No JVD present. No tracheal deviation present. No thyromegaly present.   Cardiovascular: Normal rate, regular rhythm, normal heart sounds and intact distal pulses.  Exam reveals no gallop and no friction rub.    No murmur heard.  Pulmonary/Chest: Effort normal and breath sounds normal. No stridor. No

## 2017-04-14 NOTE — ED Notes
Pt given cup and instructed on how to give a clean catch urine sample.

## 2017-04-14 NOTE — ED Provider Notes
test. Earnest BaileyMichael Haynes, DO 8:23 AM 04/14/2017    She was examined by Dr. Cleon GustinAshby. We will D/C home with Bactroban for impetigo. She was encouraged to fill her prescriptions for Metronidazole and Zofran that she received yesterday. Earnest BaileyMichael Haynes, DO 9:21 AM 04/14/2017      MDM   Decide to obtain history from someone other than the patient: No    Decide to obtain previous medical records: Yes - The review of the records selected below is documented in the ED Note : ( Prior ED visit )    Clinical Lab Test(s): Ordered and Reviewed    Diagnostic Tests (Radiology, EKG): N/A    Independent Visualization (ED US, Wet Prep, Other): No    Discussed patient with NON-ED Provider: None      ED Disposition   ED Disposition: Discharge      ED Clinical Impression   ED Clinical Impression:   Generalized abdominal pain  Non compliance with medical treatment  Vomiting, intractability of vomiting not specified, presence of nausea not specified, unspecified vomiting type  Impetigo      ED Patient Status   Patient Status:   Good        ED Medical Evaluation Initiated   Medical Evaluation Initiated:  Yes, filed at 04/14/17 0717  by Roney MansHaynes, Michael, DO            Haynes, Michael, DO  Resident  04/14/17 [phone]

## 2017-04-14 NOTE — ED Provider Notes
Cheyenne Rhodes, Joseph Daniel, MD  Resident  04/14/17 0040       Cheyenne Rhodes, Joseph Daniel, MD  Resident  04/14/17 0040       Cheyenne Rhodes, Joseph Daniel, MD  Resident  04/14/17 [phone]

## 2017-04-14 NOTE — ED Provider Notes
has not followed up with obgyn   [JD]   0025 Hemoglobin baseline; no bleeding on exam , hemoccult negative, ceftriaxone IV, azithromycin,flagyl x 7 days ( first dose here ) ; serial abodminal exam benign no emesis here  [JD]      ED Course User Index  [JD] Rosilyn Mingsiaz, Joseph Daniel, MD      Flagyl as outopatient x 7 days , obgyn recheck within 48 hours, return to ED if syumptoms worsen or cannot take po at home with zofran odt, Patient stable at this time, NAD, tolerating PO, ambulating with steady based gait, VSS, informed of results, and counseled regarding appropriate follow-up plan at this point. Patient has been given strict return precautions and instructed to follow up on out-patient basis, but to return to ED if condition worsens or does not improve. Patient agrees to current plan and verbalizes their understanding of the instruction.       MDM   Decide to obtain history from someone other than the patient: No    Decide to obtain previous medical records: Yes - The review of the records selected below is documented in the ED Note : ( Prior ED visit )    Clinical Lab Test(s): Ordered and Reviewed    Diagnostic Tests (Radiology, EKG): Ordered and Reviewed    Independent Visualization (ED US, Wet Prep, Other): Yes - Documented in ED Provider Note    Discussed patient with NON-ED Provider: None      ED Disposition   ED Disposition: discharge    ED Clinical Impression   ED Clinical Impression:   Nausea and vomiting, intractability of vomiting not specified, unspecified vomiting type  Bipolar I disorder with depression  Non-adherence to medical treatment  Abdominal pain, unspecified abdominal location  Dyspareunia, female      ED Patient Status   Patient Status:   Good        ED Medical Evaluation Initiated   Medical Evaluation Initiated:  Yes, filed at 04/13/17 2223  by Rosilyn Mingsiaz, Joseph Daniel, MD            Rosilyn Mingsiaz, Joseph Daniel, MD  Resident  04/14/17 0040       Rosilyn Mingsiaz, Joseph Daniel, MD  Resident  04/14/17 0040

## 2017-04-14 NOTE — ED Attestation Note
Attestation   I have made corrections and additions as appropriate. I discussed this patient with the resident/fellow.       I  have reviewed the images with the resident during the limited biliary and cardiac ultrasound examination as indicated for abdominal pain. The ultrasound imaging protocol was performed as listed in the resident procedure note. I have discussed the findings with the resident and agree with the findings documented in the resident's note.  The final interpretation of those findings is/are biliary No gallstones or thickened GB wall and cardiac no sonographic evidence of volume depletion.       Ronaldo Miyamotooe, Edward J III, MD  04/14/17 [phone]

## 2017-04-14 NOTE — ED Provider Notes
Is this an Emergent Medical Condition? Yes - Severe Pain/Acute Onset of Symptons  409.901 FS  641.19 FS  627.732 (16) FS    ED Workup   ED Ultrasound Performed  Date/Time: 04/14/2017 12:37 AM  Performed by: Rosilyn MingsIAZ, JOSEPH DANIEL  Authorized by: Rosilyn MingsIAZ, JOSEPH DANIEL   The ultrasound procedure performed was right upper quadrant ultrasound.   Reasons for the procedure performed included abdominal pain.   Gallbladder abnormal? no.  Gallbladder anterior wall thickness (mm): .22 cm.  Gallbladder stones noted? no.  Gallbladder wall thickened? no.  Pericholecystic fluid present? no.  Common bile duct enlarged? no.  Positive ultrasound Murphy's sign? no.  Technique: gallbladder evaluation in transverse and longitudinal axis.  Patient position: supine.  Documentation: images saved on hard disk and still images obtained.  Patient tolerance: Patient tolerated the procedure well with no immediate complications    Patient tolerance: Patient tolerated the procedure well with no immediate complications.    US reviewed with ED attending        Labs:  -   CBC AUTODIFF - Abnormal        Result Value Ref Range    WBC 4.20 (*) 4.5 - 11 x10E3/uL    RBC 4.26  4.20 - 5.40 x10E6/uL    Hemoglobin 10.5 (*) 12.0 - 16.0 g/dL    Hematocrit 86.533.9 (*) 37.0 - 47.0 %    MCV 79.6 (*) 82.0 - 101.0 fl    MCH 24.6 (*) 27.0 - 34.0 pg    MCHC 31.0  31.0 - 36.0 g/dL    RDW 78.419.0 (*) 69.612.0 - 16.1 %    Platelet Count 220  140 - 440 thou/cu mm    MPV 11.5  9.5 - 11.5 fl    nRBC % 0.0  0.0 - 1.0 %    Absolute NRBC Count 0.00     POCT URINALYSIS AUTO W/O MICROSCOPY - Abnormal     Color -Ur Yellow      Clarity, UA Cloudy      Spec Grav 1.030  1.003 - 1.030    pH 5.5 (*) 7.4 - 7.4    Urobilinogen -Ur 4.0 (*) <=2.0 E.U./dL    Nitrite -Ur Negative  Negative    Protein-UA Trace (*) Negative mg/dL    Glucose -Ur Negative  Negative mg/dL    Blood, UA Negative  Negative    WBC, UA Negative  Negative    Bilirubin -Ur Small (*) Negative    Ketones -UR Trace (*) Negative

## 2017-04-14 NOTE — ED Provider Notes
POCT URINE PREGNANCY - Normal    Preg Test, Urine (POC) Negative  Negative   CHLAMYDIA/ GONORRHEA DETECTIO*   BASIC METABOLIC PANEL   HEPATIC FUNCTION PANEL   LIPASE   LITHIUM LEVEL   POCT URINE PREGNANCY   POCT URINALYSIS AUTO W/O MICROSCOPY   POCT GLUCOSE   CBC AND DIFFERENTIAL         Imaging (Read by ED Provider):      Per Radiology:  Xr Chest Single View    Result Date: 04/13/2017  No acute pulmonary process. I personally reviewed the images and the residents findings and agree with the above. Read By Valentino Nose- Jamie Ledford M.D.  Electronically Verified By Valentino Nose- Jamie Ledford M.D.  Released Date Time - 04/13/2017 11:47 PM  Resident - Dub AmisBoris Kumaev            EKG (Read by ED Provider):  NSR , INTERVALS WNL, NO STEMI CRITERIA        ED Course & Re-Evaluation     ED Course as of Apr 15 39   Thu Apr 13, 2017   2300 Pending BEDISDE POCUS RUQ SCAN , PELIC, AND RECTAL EXAM ,when chapronme is available, tylenol for pain control now, zofran for nausea / emesis control , no emeisi yetin ED , VS stable  [JD]   2322 Specific Gravity -Ur: 1.030 [JD]   2323 Signs of dehydration on urine, 1 L ns running, vs wnl  [JD]   2352 Urine Pregnancy Test: Negative [JD]   2356  Primary Coverage:  MEDICAID [place] - MEDICAID [place]INE      contacted clinical care coordinator to arrange followup care    followup with PCP and obgyn   ed as needed   [JD]   2357 Patient pending IV placement , blood collection, pelvic and rectal exam.  RN staff aware of plan with clsoed loop communication  [JD]   Fri Apr 14, 2017   0002 Patient now is requesting to go to the bathroom to urinate before her pelvic exam ; walking with steady based gait  [JD]   0011 Call back number [phone] confirmed with patietn at bedside    per chart review patient has been seen for similar presetnations in the ED    02/10/2017  02/15/2017  prior docuemntation of IUD , which patient did not disclose during this visit on intial interview

## 2017-04-14 NOTE — ED Provider Notes
No medications on file   Discontinued Medications    No medications on file       Past Medical History:   Diagnosis Date   ? Bipolar 1 disorder    ? Bipolar I disorder with depression 02/05/2017   ? Candidal vulvovaginitis 02/08/2017   ? Depression    ? Non-adherence to medical treatment 02/05/2017   ? Personal history of noncompliance with medical treatment, presenting hazards to health    ? Seizures, post-traumatic    ? Sickle cell trait        History reviewed. No pertinent surgical history.    History reviewed. No pertinent family history.    Social History     Social History   ? Marital status: Single     Spouse name: N/A   ? Number of children: N/A   ? Years of education: N/A     Social History Main Topics   ? Smoking status: Never Smoker   ? Smokeless tobacco: Never Used   ? Alcohol use No   ? Drug use: No   ? Sexual activity: Not Asked     Other Topics Concern   ? None     Social History Narrative   ? None       Review of Systems   Constitutional: Positive for chills. Negative for fever, diaphoresis, activity change, appetite change, fatigue and unexpected weight change.   HENT: Positive for sore throat. Negative for ear pain, trouble swallowing, neck pain and voice change.    Eyes: Negative for pain, redness and visual disturbance.   Respiratory: Negative for cough and shortness of breath.    Cardiovascular: Negative for chest pain, palpitations and leg swelling.   Gastrointestinal: Positive for nausea, vomiting, abdominal pain and diarrhea. Negative for constipation, blood in stool, anal bleeding and rectal pain.   Genitourinary: Positive for dysuria, vaginal pain and dyspareunia. Negative for frequency, hematuria, flank pain, decreased urine volume, vaginal bleeding, vaginal discharge and difficulty urinating.   Musculoskeletal: Negative for myalgias, back pain, joint swelling, arthralgias and gait problem.   Skin: Negative for color change, pallor, rash and wound.

## 2017-04-14 NOTE — ED Provider Notes
History     Chief Complaint   Patient presents with   ? Vomiting Blood       21 yo F  PMHX  depression  bipolar 1 d/o  sickle cell trait  seziures?  medical noncompliance  Previously on lithium ( stopped )  "it feels like my ulcers"    cc n/v/d x 3 days    Associated pain during intercourse (new)  Dysuria    LMP ended yesterday   Sexually active  No barrier contraceptive  No other form sof birth control   Ano vaginal bleeding today   Denies vaginal discharge  Patient is trying to get pregnant, P0G0 hx         The history is provided by the patient. No language interpreter was used.   Emesis   Severity:  Unable to specify  Duration:  4 days  Timing:  Intermittent  Emesis appearance: variable, food content with dark emesis at times; no brigth red emesis.  Able to tolerate:  Liquids and solids  Progression:  Worsening  Chronicity:  Recurrent  Recent urination:  Normal  Context: not post-tussive and not self-induced    Relieved by:  Nothing  Worsened by:  Nothing  Ineffective treatments:  None tried  Associated symptoms: abdominal pain, chills, diarrhea and sore throat    Associated symptoms: no arthralgias, no cough, no fever, no headaches, no myalgias and no URI    Risk factors: alcohol use    Risk factors: no diabetes, not pregnant, no prior abdominal surgery, no sick contacts, no suspect food intake and no travel to endemic areas        Allergies   Allergen Reactions   ? Chocolate Angioedema   ? Risperidone Other (See Comments)     "I dont really know, I just know I'm allergic"       Patient's Medications   New Prescriptions    No medications on file   Previous Medications    LITHIUM 300 MG PO CAPSULE    Take 1 capsule by mouth 2 times daily (with meals). Reasons: Manic-Depression    OLANZAPINE (ZYPREXA) 10 MG PO TABLET    Take 1 tablet by mouth nightly at bedtime. Reasons: Manic-Depression    SERTRALINE (ZOLOFT) 50 MG PO TABLET    Take 1 tablet by mouth daily. Reasons: Depression   Modified Medications

## 2017-04-14 NOTE — ED Provider Notes
History     Chief Complaint   Patient presents with   ? Vomiting Blood       21 yo F  PMHX  depression  bipolar 1 d/o  sickle cell trait  seziures?  medical noncompliance  Previously on lithium ( stopped )  "it feels like my ulcers"    cc n/v/d x 3 days    Associated pain during intercourse (new)  Dysuria    LMP ended yesterday   Sexually active  No barrier contraceptive  No other form sof birth control   Ano vaginal bleeding today   Denies vaginal discharge  Patient is trying to get pregnant, P0G0 hx         The history is provided by the patient. No language interpreter was used.   Emesis   Severity:  Unable to specify  Duration:  4 days  Timing:  Intermittent  Emesis appearance: variable, food content with dark emesis at times; no brigth red emesis.  Able to tolerate:  Liquids and solids  Progression:  Worsening  Chronicity:  Recurrent  Recent urination:  Normal  Context: not post-tussive and not self-induced    Relieved by:  Nothing  Worsened by:  Nothing  Ineffective treatments:  None tried  Associated symptoms: abdominal pain, chills, diarrhea and sore throat    Associated symptoms: no arthralgias, no cough, no fever, no headaches, no myalgias and no URI    Risk factors: alcohol use    Risk factors: no diabetes, not pregnant, no prior abdominal surgery, no sick contacts, no suspect food intake and no travel to endemic areas        Allergies   Allergen Reactions   ? Chocolate Angioedema   ? Risperidone Other (See Comments)     "I dont really know, I just know I'm allergic"       Patient's Medications   New Prescriptions    ACETAMINOPHEN (TYLENOL) 500 MG PO TABLET    Take 1 tablet by mouth every 6 hours as needed.    METRONIDAZOLE (FLAGYL) 500 MG PO TABLET    Take 1 tablet by mouth 2 times daily for 7 days.    ONDANSETRON (ZOFRAN-ODT) 4 MG PO TABLET DISINTEGRATING    Dissolve 1 tablet in mouth every 8 hours as needed for vomiting.   Previous Medications

## 2017-04-14 NOTE — ED Notes
EDT attempting to get an IV. VSS. Pt in NAD. Dr. US abdomen at bedside.

## 2017-04-14 NOTE — ED Provider Notes
History     Chief Complaint   Patient presents with   ? Vomiting Blood       21 yo F  PMHX  depression  bipolar 1 d/o  sickle cell trait  seziures?  medical noncompliance  Previously on lithium ( stopped )  "it feels like my ulcers"    cc n/v/d x 3 days    Associated pain during intercourse (new)  Dysuria    LMP ended yesterday   Sexually active  No barrier contraceptive  No other form sof birth control   Ano vaginal bleeding today   Denies vaginal discharge  Patient is trying to get pregnant, P0G0 hx         The history is provided by the patient. No language interpreter was used.   Emesis   Severity:  Unable to specify  Duration:  4 days  Timing:  Intermittent  Emesis appearance: variable, food content with dark emesis at times; no brigth red emesis.  Able to tolerate:  Liquids and solids  Progression:  Worsening  Chronicity:  Recurrent  Recent urination:  Normal  Context: not post-tussive and not self-induced    Relieved by:  Nothing  Worsened by:  Nothing  Ineffective treatments:  None tried  Associated symptoms: abdominal pain, chills, diarrhea and sore throat    Associated symptoms: no arthralgias, no cough, no fever, no headaches, no myalgias and no URI    Risk factors: alcohol use    Risk factors: no diabetes, not pregnant, no prior abdominal surgery, no sick contacts, no suspect food intake and no travel to endemic areas        Allergies   Allergen Reactions   ? Chocolate Angioedema   ? Risperidone Other (See Comments)     "I dont really know, I just know I'm allergic"       Patient's Medications   New Prescriptions    ACETAMINOPHEN (TYLENOL) 500 MG PO TABLET    Take 1 tablet by mouth every 6 hours as needed.   Previous Medications    LITHIUM 300 MG PO CAPSULE    Take 1 capsule by mouth 2 times daily (with meals). Reasons: Manic-Depression    OLANZAPINE (ZYPREXA) 10 MG PO TABLET    Take 1 tablet by mouth nightly at bedtime. Reasons: Manic-Depression

## 2017-04-14 NOTE — ED Provider Notes
ONDANSETRON (ZOFRAN-ODT) 4 MG PO TABLET DISINTEGRATING    Dissolve 1 tablet in mouth every 8 hours as needed for vomiting.    SERTRALINE (ZOLOFT) 50 MG PO TABLET    Take 1 tablet by mouth daily. Reasons: Depression   Modified Medications    No medications on file   Discontinued Medications    No medications on file       Past Medical History:   Diagnosis Date   ? Bipolar 1 disorder    ? Bipolar I disorder with depression 02/05/2017   ? Candidal vulvovaginitis 02/08/2017   ? Depression    ? Non-adherence to medical treatment 02/05/2017   ? Personal history of noncompliance with medical treatment, presenting hazards to health    ? Seizures, post-traumatic    ? Sickle cell trait        History reviewed. No pertinent surgical history.    No family history on file.    Social History     Social History   ? Marital status: Single     Spouse name: N/A   ? Number of children: N/A   ? Years of education: N/A     Social History Main Topics   ? Smoking status: Never Smoker   ? Smokeless tobacco: Never Used   ? Alcohol use No   ? Drug use: No   ? Sexual activity: Not Asked     Other Topics Concern   ? None     Social History Narrative   ? None       Review of Systems   Constitutional: Negative.    HENT: Negative.    Eyes: Negative.    Respiratory: Negative.    Cardiovascular: Negative.    Gastrointestinal: Positive for vomiting, abdominal pain and diarrhea.   Genitourinary: Negative.    Musculoskeletal: Negative.    Skin: Positive for rash.   Neurological: Negative.    Psychiatric/Behavioral: Negative.  Negative for suicidal ideas and depression.   Allergic/Immunologic: negative.    Endocrine: negative.       Physical Exam     ED Triage Vitals [04/14/17 0703]   BP 108/59   Pulse 75   Resp 18   Temp 36.4 ?C (97.6 ?F)   Temp src    Height 1.702 m   Weight 66.2 kg   SpO2 98 %   BMI (Calculated) 22.91             Physical Exam   Constitutional: She is oriented to person, place, and time. She appears well-developed. No distress.   HENT:

## 2017-04-14 NOTE — ED Provider Notes
Imaging (Read by ED Provider):  Per Radiology:  Ct Head W/o Con    Result Date: 04/05/2017  No acute intracranial hemorrhage, mass effect or territorial infarct.  No posttraumatic sequela. I personally reviewed the images and the residents findings and agree with the above. Read By Para March- Erick Blaudeau  Electronically Verified By - Para MarchErick Blaudeau  Released Date Time - 04/05/2017 11:45 PM  Resident - Tamala SerMario Agrait Bertran    Per Radiology:  Xr Ankle Left 3 Or More Views    Result Date: 04/06/2017  No acute fracture. I personally reviewed the images and the residents findings and agree with the above. Read By Para March- Erick Blaudeau  Electronically Verified By - Para MarchErick Blaudeau  Released Date Time - 04/06/2017 6:09 AM  Resident - Lora PaulaAnushi Patel    Per Radiology:  Xr Chest Single View    Result Date: 04/13/2017  No acute pulmonary process. I personally reviewed the images and the residents findings and agree with the above. Read By Valentino Nose- Jamie Ledford M.D.  Electronically Verified By Valentino Nose- Jamie Ledford M.D.  Released Date Time - 04/13/2017 11:47 PM  Resident - Dub AmisBoris Kumaev    Per Radiology:  Xr Elbow Right Min 3 Views    Result Date: 04/06/2017  No acute fracture. Trace soft tissue stranding. I personally reviewed the images and the residents findings and agree with the above. Read By Para March- Erick Blaudeau  Electronically Verified By - Para MarchErick Blaudeau  Released Date Time - 04/06/2017 6:07 AM  Resident - Lora PaulaAnushi Patel        EKG (Read by ED Provider):  NSR , INTERVALS WNL, NO STEMI CRITERIA        ED Course & Re-Evaluation     ED Course as of Apr 14 30   Thu Apr 13, 2017   2300 Pending BEDISDE POCUS RUQ SCAN , PELIC, AND RECTAL EXAM ,when chapronme is available, tylenol for pain control now, zofran for nausea / emesis control , no emeisi yetin ED , VS stable  [JD]   2322 Specific Gravity -Ur: 1.030 [JD]   2323 Signs of dehydration on urine, 1 L ns running, vs wnl  [JD]   2352 Urine Pregnancy Test: Negative [JD]

## 2017-04-14 NOTE — ED Provider Notes
exhibits no distension and no mass. There is tenderness (mild periumbilical and bilateral ( r and l ) lower quadrant ttp ]\) in the right lower quadrant, periumbilical area, suprapubic area and left lower quadrant. There is no rigidity, no rebound, no guarding, no CVA tenderness, no tenderness at McBurney's point and negative Murphy's sign. No hernia.   Genitourinary: Rectal exam shows guaiac negative stool. Vaginal discharge found.   Genitourinary Comments: Rectal Exam:   Patient verbally consented to undergo rectal examination   Exam performed with chaperone RN .   Perirectal lesions or fissures: no  External sphincter tone intact: yes   Rectal vault without masses: yes  Stool LIGHT BROWN   Hemoccult NEGATIVE  Core Temp 98.41F      Pelvic Exam:   Patient verbally consented to undergo pelvic exam  Exam performed with chaperone, RN.   External genitalia without lesions, or masses    Vaginal mucosa pink  Vaginal Vault with Discharge  Vaginal Vault without Blood  Cervix pink and non-friable, os closed  no cervical motion tenderness  no adnexal tenderness to palpation  Uterus smooth, no masses palpated  Wet prep shows NO trich, YES clue cells, NO candida   yes GC/CL Culture Swab collected     Musculoskeletal: Normal range of motion. She exhibits no edema, tenderness or deformity.   Lymphadenopathy:     She has no cervical adenopathy.   Neurological: She is alert and oriented to person, place, and time. No cranial nerve deficit.   Skin: Skin is warm and dry. No rash noted. She is not diaphoretic. No erythema. No pallor.   Psychiatric: She has a normal mood and affect. Her behavior is normal. Judgment and thought content normal.   DENIES SI AND HI     Nursing note and vitals reviewed.      Differential DDx: bacterial infection , viral infection , STD, UTI, PUD, ACS, arrythmia, polysubstance abuse, dehydration , gi bleed, ectopic, cyst, pid, appendicits, other

## 2017-04-14 NOTE — ED Provider Notes
SERTRALINE (ZOLOFT) 50 MG PO TABLET    Take 1 tablet by mouth daily. Reasons: Depression   Modified Medications    No medications on file   Discontinued Medications    No medications on file       Past Medical History:   Diagnosis Date   ? Bipolar 1 disorder    ? Bipolar I disorder with depression 02/05/2017   ? Candidal vulvovaginitis 02/08/2017   ? Depression    ? Non-adherence to medical treatment 02/05/2017   ? Personal history of noncompliance with medical treatment, presenting hazards to health    ? Seizures, post-traumatic    ? Sickle cell trait        History reviewed. No pertinent surgical history.    History reviewed. No pertinent family history.    Social History     Social History   ? Marital status: Single     Spouse name: N/A   ? Number of children: N/A   ? Years of education: N/A     Social History Main Topics   ? Smoking status: Never Smoker   ? Smokeless tobacco: Never Used   ? Alcohol use No   ? Drug use: No   ? Sexual activity: Not Asked     Other Topics Concern   ? None     Social History Narrative   ? None       Review of Systems   Constitutional: Positive for chills. Negative for fever, diaphoresis, activity change, appetite change, fatigue and unexpected weight change.   HENT: Positive for sore throat. Negative for ear pain, trouble swallowing, neck pain and voice change.    Eyes: Negative for pain, redness and visual disturbance.   Respiratory: Negative for cough and shortness of breath.    Cardiovascular: Negative for chest pain, palpitations and leg swelling.   Gastrointestinal: Positive for nausea, vomiting, abdominal pain and diarrhea. Negative for constipation, blood in stool, anal bleeding and rectal pain.   Genitourinary: Positive for dysuria, vaginal pain and dyspareunia. Negative for frequency, hematuria, flank pain, decreased urine volume, vaginal bleeding, vaginal discharge and difficulty urinating.   Musculoskeletal: Negative for myalgias, back pain, joint swelling,

## 2017-04-14 NOTE — ED Triage Notes
Pt ambulates with steady gait to triage with c/o vomiting blood x 3 days. Pt reports vomit as dark. Pt replies "yes" when asked if vomit looks like coffee grinds. Pt also reports diarrhea x 2 days ago and reports that it was watery and dark. Pt also reports twicthing. No tremors or twitching noted at this time. Pt denies CP, SOB, fevers, and chills. Pt states she has been very tired. Pt states that she has been lightheaded and caught herslef from falling. Pt states that she lives with fiance. NAD noted at this time.

## 2017-04-14 NOTE — ED Provider Notes
Platelet Clumps Present      Large Platelets Present      Vacuoles Present      Poikilocytes Slight      Diff Comment #DIGIM      Comment: Smudge cells present.   LITHIUM LEVEL   POCT URINE PREGNANCY   POCT URINALYSIS AUTO W/O MICROSCOPY   POCT GLUCOSE   CBC AND DIFFERENTIAL         Imaging (Read by ED Provider):      Per Radiology:  Xr Chest Single View    Result Date: 04/13/2017  No acute pulmonary process. I personally reviewed the images and the residents findings and agree with the above. Read By Valentino Nose- Jamie Ledford M.D.  Electronically Verified By Valentino Nose- Jamie Ledford M.D.  Released Date Time - 04/13/2017 11:47 PM  Resident - Dub AmisBoris Kumaev            EKG (Read by ED Provider):  NSR , INTERVALS WNL, NO STEMI CRITERIA        ED Course & Re-Evaluation     ED Course as of Apr 14 104   Thu Apr 13, 2017   2300 Pending BEDISDE POCUS RUQ SCAN , PELIC, AND RECTAL EXAM ,when chapronme is available, tylenol for pain control now, zofran for nausea / emesis control , no emeisi yetin ED , VS stable  [JD]   2322 Specific Gravity -Ur: 1.030 [JD]   2323 Signs of dehydration on urine, 1 L ns running, vs wnl  [JD]   2352 Urine Pregnancy Test: Negative [JD]   2356  Primary Coverage:  MEDICAID [place] [address] [place] - MEDICAID [place]SHINE      contacted clinical care coordinator to arrange followup care    followup with PCP and obgyn   ed as needed   [JD]   2357 Patient pending IV placement , blood collection, pelvic and rectal exam.  RN staff aware of plan with clsoed loop communication  [JD]   Fri Apr 14, 2017   0002 Patient now is requesting to go to the bathroom to urinate before her pelvic exam ; walking with steady based gait  [JD]   0011 Call back number [phone] confirmed with patietn at bedside    per chart review patient has been seen for similar presetnations in the ED    02/10/2017  02/15/2017  prior docuemntation of IUD , which patient did not disclose during this visit on intial interview

## 2017-04-14 NOTE — ED Provider Notes
right lower quadrant, periumbilical area, suprapubic area and left lower quadrant. There is no rigidity, no rebound, no guarding, no CVA tenderness, no tenderness at McBurney's point and negative Murphy's sign. No hernia.   Genitourinary: Rectal exam shows guaiac negative stool. Vaginal discharge found.   Genitourinary Comments: Rectal Exam:   Patient verbally consented to undergo rectal examination   Exam performed with chaperone RN .   Perirectal lesions or fissures: no  External sphincter tone intact: yes   Rectal vault without masses: yes  Stool LIGHT BROWN   Hemoccult NEGATIVE  Core Temp 98.40F      Pelvic Exam:   Patient verbally consented to undergo pelvic exam  Exam performed with chaperone, RN.   External genitalia without lesions, or masses    Vaginal mucosa pink  Vaginal Vault with Discharge  Vaginal Vault without Blood  Cervix pink and non-friable, os closed  no cervical motion tenderness  no adnexal tenderness to palpation  Uterus smooth, no masses palpated  Wet prep shows NO trich, YES clue cells, NO candida   yes GC/CL Culture Swab collected     Musculoskeletal: Normal range of motion. She exhibits no edema, tenderness or deformity.   Lymphadenopathy:     She has no cervical adenopathy.   Neurological: She is alert and oriented to person, place, and time. No cranial nerve deficit.   Skin: Skin is warm and dry. No rash noted. She is not diaphoretic. No erythema. No pallor.   Psychiatric: She has a normal mood and affect. Her behavior is normal. Judgment and thought content normal.   DENIES SI AND HI     Nursing note and vitals reviewed.      Differential DDx: bacterial infection , viral infection , STD, UTI, PUD, ACS, arrythmia, polysubstance abuse, dehydration , gi bleed, ectopic, cyst, pid, appendicits, other     Is this an Emergent Medical Condition? Yes - Severe Pain/Acute Onset of Symptons  409.901 FS  641.19 FS  627.732 (16) FS    ED Workup   Procedures    Labs:  - - No data to display

## 2017-04-14 NOTE — ED Provider Notes
Labs:  - - No data to display      Imaging (Read by ED Provider):  .ris      EKG (Read by ED Provider):  ***        ED Course & Re-Evaluation     ED Course          MDM   Decide to obtain history from someone other than the patient: No    Decide to obtain previous medical records: Yes - The review of the records selected below is documented in the ED Note : ( Prior ED visit )    Clinical Lab Test(s): Ordered and Reviewed    Diagnostic Tests (Radiology, EKG): Ordered and Reviewed    Independent Visualization (ED US, Wet Prep, Other): Yes - Documented in ED Provider Note    Discussed patient with NON-ED Provider: None      ED Disposition   ED Disposition: No ED Disposition Set      ED Clinical Impression   ED Clinical Impression:   Nausea and vomiting, intractability of vomiting not specified, unspecified vomiting type      ED Patient Status   Patient Status:   Good        ED Medical Evaluation Initiated   Medical Evaluation Initiated:  Yes, filed at 04/13/17 2223  by Rosilyn Mingsiaz, Joseph Daniel, MD

## 2017-04-14 NOTE — ED Provider Notes
POCT URINE PREGNANCY - Normal    Preg Test, Urine (POC) Negative  Negative   CHLAMYDIA/ GONORRHEA DETECTIO*   BASIC METABOLIC PANEL   HEPATIC FUNCTION PANEL   LIPASE   LITHIUM LEVEL   POCT URINE PREGNANCY   POCT URINALYSIS AUTO W/O MICROSCOPY   POCT GLUCOSE   CBC AND DIFFERENTIAL         Imaging (Read by ED Provider):      Per Radiology:  Xr Chest Single View    Result Date: 04/13/2017  No acute pulmonary process. I personally reviewed the images and the residents findings and agree with the above. Read By Valentino Nose- Jamie Ledford M.D.  Electronically Verified By Valentino Nose- Jamie Ledford M.D.  Released Date Time - 04/13/2017 11:47 PM  Resident - Dub AmisBoris Kumaev            EKG (Read by ED Provider):  NSR , INTERVALS WNL, NO STEMI CRITERIA        ED Course & Re-Evaluation     ED Course as of Apr 14 37   Thu Apr 13, 2017   2300 Pending BEDISDE POCUS RUQ SCAN , PELIC, AND RECTAL EXAM ,when chapronme is available, tylenol for pain control now, zofran for nausea / emesis control , no emeisi yetin ED , VS stable  [JD]   2322 Specific Gravity -Ur: 1.030 [JD]   2323 Signs of dehydration on urine, 1 L ns running, vs wnl  [JD]   2352 Urine Pregnancy Test: Negative [JD]   2356  Primary Coverage:  MEDICAID [place] [address] [place] - MEDICAID SUNSHINE      contacted clinical care coordinator to arrange followup care    followup with PCP and obgyn   ed as needed   [JD]   2357 Patient pending IV placement , blood collection, pelvic and rectal exam.  RN staff aware of plan with clsoed loop communication  [JD]   Fri Apr 14, 2017   0002 Patient now is requesting to go to the bathroom to urinate before her pelvic exam ; walking with steady based gait  [JD]   0011 Call back number [phone] confirmed with patietn at bedside    per chart review patient has been seen for similar presetnations in the ED    02/10/2017  02/15/2017  prior docuemntation of IUD , which patient did not disclose during this visit on intial interview

## 2017-04-14 NOTE — ED Provider Notes
Medical Evaluation Initiated:  Yes, filed at 04/14/17 [id number]  by Earnest BaileyHaynes, Michael, DO

## 2017-04-17 NOTE — ED Notes
[address]   [place] [id number]  [phone]([phone]  [phone] (H)    + gonorrhea. Pt number had mail voice. No msg left. Letter mailed to pt with results and need to f/u doh.

## 2017-04-25 ENCOUNTER — Inpatient Hospital Stay: Admit: 2017-04-25 | Discharge: 2017-04-25

## 2017-04-25 DIAGNOSIS — A549 Gonococcal infection, unspecified: Principal | ICD-10-CM

## 2017-04-25 DIAGNOSIS — F329 Major depressive disorder, single episode, unspecified: Secondary | ICD-10-CM

## 2017-04-25 DIAGNOSIS — R509 Fever, unspecified: Secondary | ICD-10-CM

## 2017-04-25 DIAGNOSIS — D573 Sickle-cell trait: Secondary | ICD-10-CM

## 2017-04-25 DIAGNOSIS — J029 Acute pharyngitis, unspecified: Secondary | ICD-10-CM

## 2017-04-25 DIAGNOSIS — R561 Post traumatic seizures: Secondary | ICD-10-CM

## 2017-04-25 DIAGNOSIS — B373 Candidiasis of vulva and vagina: Secondary | ICD-10-CM

## 2017-04-25 DIAGNOSIS — F319 Bipolar disorder, unspecified: Secondary | ICD-10-CM

## 2017-04-25 DIAGNOSIS — R000 Tachycardia, unspecified: Secondary | ICD-10-CM

## 2017-04-25 DIAGNOSIS — Z9119 Patient's noncompliance with other medical treatment and regimen: Secondary | ICD-10-CM

## 2017-04-25 DIAGNOSIS — Z590 Homelessness: Secondary | ICD-10-CM

## 2017-04-25 DIAGNOSIS — J039 Acute tonsillitis, unspecified: Secondary | ICD-10-CM

## 2017-04-25 DIAGNOSIS — R109 Unspecified abdominal pain: Secondary | ICD-10-CM

## 2017-04-25 MED ORDER — AZITHROMYCIN 250 MG PO TABS
1000 mg | Freq: Once | ORAL | Status: CP
Start: 2017-04-25 — End: ?

## 2017-04-25 MED ORDER — CEFTRIAXONE SODIUM 250 MG IJ SOLR
250 mg | Freq: Once | INTRAMUSCULAR | Status: CP
Start: 2017-04-25 — End: ?

## 2017-04-25 MED ORDER — ONDANSETRON 4 MG PO TBDP
4 mg | Freq: Once | ORAL | Status: CP
Start: 2017-04-25 — End: ?

## 2017-04-25 MED ORDER — METRONIDAZOLE 500 MG PO TABS
2000 mg | Freq: Once | ORAL | Status: CP
Start: 2017-04-25 — End: ?

## 2017-04-25 NOTE — ED Provider Notes
Independent Visualization (ED US, Wet Prep, Other): {SH ED Lamonte SakaiJX MDM NO YES [id number]:26444}    Discussed patient with NON-ED Provider: {SH ED Lamonte SakaiJX MDM - ANOTHER PROVIDER:28381}      ED Disposition   ED Disposition: No ED Disposition Set      ED Clinical Impression   ED Clinical Impression:   No Clinical Impression Set      ED Patient Status   Patient Status:   {[phone]}        ED Medical Evaluation Initiated   Medical Evaluation Initiated:  Yes, filed at 04/25/17 1305  by Earnest BaileyHaynes, Michael, DO

## 2017-04-25 NOTE — ED Provider Notes
month. On exam she has enlarged tonsils and is tachycardic. We will get a rapid strep and pregnancy test at this time. She is having RLQ pain and declined a vaginal exam. I will discuss the case with Dr. Denice BorsLuten. Earnest BaileyMichael Haynes, DO 1:29 PM 04/25/2017    Patients Gonorrhea positive from 5/11 with no treatment. She continues to deny vaginal symptoms and does not want a vaginal exam. We will give Rocephin, Azithromycin, Metronidazole, and Zofran at this time. Earnest BaileyMichael Haynes, DO 2:22 PM 04/25/2017    Patient was given antibioitcs. We will call case management. The patient needs to leave to get a back at her group home. We will see if case management can call to reserve her a bed. Earnest BaileyMichael Haynes, DO 3:12 PM 04/25/2017    MDM   Decide to obtain history from someone other than the patient: {SH ED Lamonte SakaiJX MDM - OBTAIN ZOXWRUE:45409}HISTORY:28378}    Decide to obtain previous medical records: [facility]{SH ED Lamonte SakaiJX MDM - PREVIOUS MED REC - NO WJX:91478}YES:28380}    Clinical Lab Test(s): {SH ED Lamonte SakaiJX MDM ORDERED AND REVIEWED:28124}    Diagnostic Tests (Radiology, EKG): {SH ED Lamonte SakaiJX MDM ORDERED AND REVIEWED:28124}    Independent Visualization (ED US, Wet Prep, Other): {SH ED Lamonte SakaiJX MDM NO YES [id number]:86578}WILDCARD:26444}    Discussed patient with NON-ED Provider: {SH ED Lamonte SakaiJX MDM - ANOTHER PROVIDER:28381}      ED Disposition   ED Disposition: No ED Disposition Set      ED Clinical Impression   ED Clinical Impression:   Gonorrhea  Tonsillitis      ED Patient Status   Patient Status:   {[phone]}        ED Medical Evaluation Initiated   Medical Evaluation Initiated:  Yes, filed at 04/25/17 1305  by Earnest BaileyHaynes, Michael, DO

## 2017-04-25 NOTE — ED Notes
Reviewed d/c Paperwork with [facility] [employer]. Pt refused vitals stating she is ready to go. Pt d/c home in stable condition with all belongings. No other needs noted

## 2017-04-25 NOTE — ED Provider Notes
month. On exam she has enlarged tonsils and is tachycardic. We will get a rapid strep and pregnancy test at this time. She is having RLQ pain and declined a vaginal exam. I will discuss the case with Dr. Denice BorsLuten. Earnest BaileyMichael Haynes, DO 1:29 PM 04/25/2017    Patients Gonorrhea positive from 5/11 with no treatment. She continues to deny vaginal symptoms and does not want a vaginal exam. We will give Rocephin, Azithromycin, Metronidazole, and Zofran at this time. Earnest BaileyMichael Haynes, DO 2:22 PM 04/25/2017    Patient was given antibioitcs. We will call case management. The patient needs to leave to get a back at her group home. We will see if case management can call to reserve her a bed. Earnest BaileyMichael Haynes, DO 3:12 PM 04/25/2017    Patient was spoken to about going to health department for further testing including HIV and Syphilis. She said she understands. She would like to be discharged now. We will DC home. Earnest BaileyMichael Haynes, DO 4:15 PM 04/25/2017      MDM   Decide to obtain history from someone other than the patient: Yes - Family/ Caregiver    Decide to obtain previous medical records: Yes - The review of the records selected below is documented in the ED Note : ( Prior ED visit )    Clinical Lab Test(s): Ordered and Reviewed    Diagnostic Tests (Radiology, EKG): N/A    Independent Visualization (ED US, Wet Prep, Other): No    Discussed patient with NON-ED Provider: None      ED Disposition   ED Disposition: Discharge      ED Clinical Impression   ED Clinical Impression:   Gonorrhea  Tonsillitis      ED Patient Status   Patient Status:   Good        ED Medical Evaluation Initiated   Medical Evaluation Initiated:  Yes, filed at 04/25/17 1305  by Earnest BaileyHaynes, Michael, Ellison Carwin            Haynes, Michael, DO  Resident  04/25/17 1617       Earnest BaileyHaynes, Michael, DO  Resident  04/25/17 [phone]

## 2017-04-25 NOTE — ED Provider Notes
Weight 73.5 kg   SpO2 97 %   BMI (Calculated) 25.43             Physical Exam   Constitutional: She is oriented to person, place, and time. She appears well-developed and well-nourished. No distress.   HENT:   Right Ear: External ear normal.   Left Ear: External ear normal.   Mouth/Throat: Posterior oropharyngeal erythema present. No oropharyngeal exudate.       Bilateral tonsil enlargement and erythema   Eyes: Conjunctivae and EOM are normal. Right eye exhibits no discharge. Left eye exhibits no discharge.   Neck: Normal range of motion. Neck supple.   Cardiovascular: Regular rhythm.    No murmur heard.  Tachycardic   Pulmonary/Chest: Effort normal and breath sounds normal. No respiratory distress. She has no wheezes. She has no rales. She exhibits no tenderness.   Abdominal: Soft. Bowel sounds are normal. She exhibits no distension. There is tenderness in the right lower quadrant. There is no rebound.       Tenderness to RLQ, no suprapubic tenderness   Musculoskeletal: She exhibits no edema, tenderness or deformity.   Lymphadenopathy:     She has no cervical adenopathy.   Neurological: She is alert and oriented to person, place, and time.   Skin: Skin is warm and dry.       Differential DDx: ***    Is this an Emergent Medical Condition? {SH ED EMERGENT MEDICAL [phone]}  409.901 FS  641.19 FS  627.732 (16) FS    ED Workup   Procedures    Labs:  - - No data to display      Imaging (Read by ED Provider):  {Imaging [phone]}      EKG (Read by ED Provider):  {EKG findings:[phone]}        ED Course & Re-Evaluation     ED Course          MDM   Decide to obtain history from someone other than the patient: {SH ED Lamonte SakaiJX MDM - OBTAIN ZOXWRUE:45409}HISTORY:28378}    Decide to obtain previous medical records: [facility]{SH ED Lamonte SakaiJX MDM - PREVIOUS MED REC - NO WJX:91478}YES:28380}    Clinical Lab Test(s): {SH ED Lamonte SakaiJX MDM ORDERED AND REVIEWED:28124}    Diagnostic Tests (Radiology, EKG): {SH ED Lamonte SakaiJX MDM ORDERED AND REVIEWED:28124}

## 2017-04-25 NOTE — ED Provider Notes
Decide to obtain history from someone other than the patient: {SH ED Lamonte SakaiJX MDM - OBTAIN MWUXLKG:40102}HISTORY:28378}    Decide to obtain previous medical records: [facility]{SH ED Lamonte SakaiJX MDM - PREVIOUS MED REC - NO VOZ:36644}YES:28380}    Clinical Lab Test(s): {SH ED Lamonte SakaiJX MDM ORDERED AND REVIEWED:28124}    Diagnostic Tests (Radiology, EKG): {SH ED Lamonte SakaiJX MDM ORDERED AND REVIEWED:28124}    Independent Visualization (ED US, Wet Prep, Other): {SH ED Lamonte SakaiJX MDM NO YES IHKVQQVZ:56387}WILDCARD:26444}    Discussed patient with NON-ED Provider: {SH ED Lamonte SakaiJX MDM - ANOTHER PROVIDER:28381}      ED Disposition   ED Disposition: No ED Disposition Set      ED Clinical Impression   ED Clinical Impression:   No Clinical Impression Set      ED Patient Status   Patient Status:   {[facility]:[id number]}        ED Medical Evaluation Initiated   Medical Evaluation Initiated:  Yes, filed at 04/25/17 1305  by Earnest BaileyHaynes, Michael, DO

## 2017-04-25 NOTE — ED Provider Notes
Weight 73.5 kg   SpO2 97 %   BMI (Calculated) 25.43             Physical Exam   Constitutional: She is oriented to person, place, and time. She appears well-developed and well-nourished. No distress.   HENT:   Right Ear: External ear normal.   Left Ear: External ear normal.   Mouth/Throat: Posterior oropharyngeal erythema present. No oropharyngeal exudate.       Bilateral tonsil enlargement and erythema   Eyes: Conjunctivae and EOM are normal. Right eye exhibits no discharge. Left eye exhibits no discharge.   Neck: Normal range of motion. Neck supple.   Cardiovascular: Regular rhythm.    No murmur heard.  Tachycardic   Pulmonary/Chest: Effort normal and breath sounds normal. No respiratory distress. She has no wheezes. She has no rales. She exhibits no tenderness.   Abdominal: Soft. Bowel sounds are normal. She exhibits no distension. There is tenderness in the right lower quadrant. There is no rebound.       Tenderness to RLQ, no suprapubic tenderness   Musculoskeletal: She exhibits no edema, tenderness or deformity.   Lymphadenopathy:     She has no cervical adenopathy.   Neurological: She is alert and oriented to person, place, and time.   Skin: Skin is warm and dry.       Differential DDx: Strep throat, tonsillitis, Viral URI    Is this an Emergent Medical Condition? Yes - Severe Pain/Acute Onset of Symptons  409.901 FS  641.19 FS  627.732 (16) FS    ED Workup   Procedures    Labs:  -   POCT RAPID STREP A - Normal       Result Value Ref Range    Rapid Strep A Antigen (POC) negative     POCT URINE PREGNANCY - Normal    Preg Test, Urine (POC) negative     POCT URINE PREGNANCY - Normal    Preg Test, Urine (POC) Negative  Negative         Imaging (Read by ED Provider):  {Imaging [phone]}      EKG (Read by ED Provider):  {EKG findings:[phone]}        ED Course & Re-Evaluation     ED Course    The patient was examined. She has been seen here multiple times this

## 2017-04-25 NOTE — ED Provider Notes
History     Chief Complaint   Patient presents with   ? Flu Like Symptoms       The patient is an 21 year old female presenting with fever, sore throat, chest pain on the right side, and abdominal pain since last night. She has been to the ER multiple times this month for various reasons. [facility] and claims she had a seizure overnight that was induced by stress. She was last here on the 5/10 and given a prescription for Metronidazole and Zofran which she claims to have gotten filled. She has no other complaints at this time. She is on no medications at this time. She denies any vaginal symptoms at this time.       The history is provided by the patient. No language interpreter was used.   Flu Symptoms   Presenting symptoms: fever and sore throat    Presenting symptoms: no cough, no diarrhea and no vomiting    Fever:     Duration:  1 day    Timing:  Rare    Temp source:  Subjective    Progression:  Unchanged  Severity:  Mild  Onset quality:  Sudden  Duration:  1 day  Progression:  Unchanged  Chronicity:  New  Relieved by:  Nothing  Worsened by:  Nothing  Ineffective treatments:  None tried  Associated symptoms: no decreased appetite, no decrease in physical activity and no congestion    Risk factors: sick contacts        Allergies   Allergen Reactions   ? Chocolate Angioedema   ? Risperidone Other (See Comments)     "I dont really know, I just know I'm allergic"       Patient's Medications   New Prescriptions    No medications on file   Previous Medications    ACETAMINOPHEN (TYLENOL) 500 MG PO TABLET    Take 1 tablet by mouth every 6 hours as needed.    LITHIUM 300 MG PO CAPSULE    Take 1 capsule by mouth 2 times daily (with meals). Reasons: Manic-Depression    METRONIDAZOLE (FLAGYL) 500 MG PO TABLET    Take 1 tablet by mouth 2 times daily for 7 days.    MUPIROCIN (BACTROBAN) 2 % EX OINTMENT    Apply a small amount to ear three times a day

## 2017-04-25 NOTE — ED Triage Notes
Pt ambulates to triage w/ steady gait. Pt reports having a fever before she arrived 102 f. Pt denies taking medicine. Pt reports dcreased appetite and nausea. Pt reports body aches, chills, right sided hip pelvic pain. Pt also reports blurred vision. Tachycardic. Pt states left sided chest pain that at 7am. CP increase w/ inspiration. NAD

## 2017-04-25 NOTE — ED Notes
Mom states, "Pt refuses to drink or eat". This RN went to get ghram crackers, gatoraid and a syringe. Pt eagerly took cookie and 20ml of gatoraid without difficulty. Mom just stood there watching. Encouraged mom to wait a few minutes and give more gatoraid. MD aware

## 2017-04-25 NOTE — ED Notes
Case management called

## 2017-04-25 NOTE — ED Provider Notes
rapid strep and pregnancy test at this time. She is having RLQ pain and declined a vaginal exam. I will discuss the case with Dr. Denice BorsLuten. Earnest BaileyMichael Haynes, DO 1:29 PM 04/25/2017    Patients Gonorrhea positive from 5/11 with no treatment. She continues to deny vaginal symptoms and does not want a vaginal exam. We will give Rocephin, Azithromycin, Metronidazole, and Zofran at this time. Earnest BaileyMichael Haynes, DO 2:22 PM 04/25/2017      MDM   Decide to obtain history from someone other than the patient: {SH ED Lamonte SakaiJX MDM - OBTAIN RUEAVWU:98119}HISTORY:28378}    Decide to obtain previous medical records: [facility]{SH ED Lamonte SakaiJX MDM - PREVIOUS MED REC - NO JYN:82956}YES:28380}    Clinical Lab Test(s): {SH ED Lamonte SakaiJX MDM ORDERED AND REVIEWED:28124}    Diagnostic Tests (Radiology, EKG): {SH ED Lamonte SakaiJX MDM ORDERED AND REVIEWED:28124}    Independent Visualization (ED US, Wet Prep, Other): {SH ED Lamonte SakaiJX MDM NO YES [id number]:84696}WILDCARD:26444}    Discussed patient with NON-ED Provider: {SH ED Lamonte SakaiJX MDM - ANOTHER PROVIDER:28381}      ED Disposition   ED Disposition: No ED Disposition Set      ED Clinical Impression   ED Clinical Impression:   Gonorrhea  Tonsillitis      ED Patient Status   Patient Status:   {[facility]:[phone]}        ED Medical Evaluation Initiated   Medical Evaluation Initiated:  Yes, filed at 04/25/17 1305  by Earnest BaileyHaynes, Michael, DO

## 2017-04-25 NOTE — ED Provider Notes
Weight 73.5 kg   SpO2 97 %   BMI (Calculated) 25.43             Physical Exam   Constitutional: She is oriented to person, place, and time. She appears well-developed and well-nourished. No distress.   HENT:   Right Ear: External ear normal.   Left Ear: External ear normal.   Mouth/Throat: Posterior oropharyngeal erythema present. No oropharyngeal exudate.       Bilateral tonsil enlargement and erythema   Eyes: Conjunctivae and EOM are normal. Right eye exhibits no discharge. Left eye exhibits no discharge.   Neck: Normal range of motion. Neck supple.   Cardiovascular: Regular rhythm.    No murmur heard.  Tachycardic   Pulmonary/Chest: Effort normal and breath sounds normal. No respiratory distress. She has no wheezes. She has no rales. She exhibits no tenderness.   Abdominal: Soft. Bowel sounds are normal. She exhibits no distension. There is tenderness in the right lower quadrant. There is no rebound.       Tenderness to RLQ, no suprapubic tenderness   Musculoskeletal: She exhibits no edema, tenderness or deformity.   Lymphadenopathy:     She has no cervical adenopathy.   Neurological: She is alert and oriented to person, place, and time.   Skin: Skin is warm and dry.       Differential DDx: Strep throat, tonsillitis, Viral URI    Is this an Emergent Medical Condition? Yes - Severe Pain/Acute Onset of Symptons  409.901 FS  641.19 FS  627.732 (16) FS    ED Workup   Procedures    Labs:  -   POCT RAPID STREP A - Normal       Result Value Ref Range    Rapid Strep A Antigen (POC) negative     POCT URINE PREGNANCY - Normal    Preg Test, Urine (POC) negative     POCT URINE PREGNANCY - Normal    Preg Test, Urine (POC) Negative  Negative   POCT RAPID STREP A - Normal    Rapid Strep POC Negative  Negative         Imaging (Read by ED Provider):  not applicable      EKG (Read by ED Provider):  not applicable        ED Course & Re-Evaluation     ED Course    The patient was examined. She has been seen here multiple times this

## 2017-04-25 NOTE — ED Provider Notes
month. On exam she has enlarged tonsils and is tachycardic. We will get a rapid strep and pregnancy test at this time. She is having RLQ pain and declined a vaginal exam. I will discuss the case with Dr. Denice BorsLuten. Earnest BaileyMichael Haynes, DO 1:29 PM 04/25/2017    Patients Gonorrhea positive from 5/11 with no treatment. She continues to deny vaginal symptoms and does not want a vaginal exam. We will give Rocephin, Azithromycin, Metronidazole, and Zofran at this time. Earnest BaileyMichael Haynes, DO 2:22 PM 04/25/2017    Patient was given antibioitcs. We will call case management. The patient needs to leave to get a back at her group home. We will see if case management can call to reserve her a bed. Earnest BaileyMichael Haynes, DO 3:12 PM 04/25/2017    Patient was spoken to about going to health department for further testing including HIV and Syphilis. She said she understands. She would like to be discharged now. We will DC home. Earnest BaileyMichael Haynes, DO 4:15 PM 04/25/2017      MDM   Decide to obtain history from someone other than the patient: Yes - Family/ Caregiver    Decide to obtain previous medical records: Yes - The review of the records selected below is documented in the ED Note : ( Prior ED visit )    Clinical Lab Test(s): Ordered and Reviewed    Diagnostic Tests (Radiology, EKG): N/A    Independent Visualization (ED US, Wet Prep, Other): No    Discussed patient with NON-ED Provider: None      ED Disposition   ED Disposition: Discharge      ED Clinical Impression   ED Clinical Impression:   Gonorrhea  Tonsillitis      ED Patient Status   Patient Status:   Good        ED Medical Evaluation Initiated   Medical Evaluation Initiated:  Yes, filed at 04/25/17 1305  by Earnest BaileyHaynes, Michael, Ellison Carwin            Haynes, Michael, DO  Resident  04/25/17 [phone]

## 2017-04-25 NOTE — ED Notes
Pt resting in bed

## 2017-04-25 NOTE — ED Provider Notes
OLANZAPINE (ZYPREXA) 10 MG PO TABLET    Take 1 tablet by mouth nightly at bedtime. Reasons: Manic-Depression    ONDANSETRON (ZOFRAN-ODT) 4 MG PO TABLET DISINTEGRATING    Dissolve 1 tablet in mouth every 8 hours as needed for vomiting.    SERTRALINE (ZOLOFT) 50 MG PO TABLET    Take 1 tablet by mouth daily. Reasons: Depression   Modified Medications    No medications on file   Discontinued Medications    No medications on file       Past Medical History:   Diagnosis Date   ? Bipolar 1 disorder    ? Bipolar I disorder with depression 02/05/2017   ? Candidal vulvovaginitis 02/08/2017   ? Depression    ? Non-adherence to medical treatment 02/05/2017   ? Personal history of noncompliance with medical treatment, presenting hazards to health    ? Seizures, post-traumatic    ? Sickle cell trait        History reviewed. No pertinent surgical history.    No family history on file.    Social History     Social History   ? Marital status: Single     Spouse name: N/A   ? Number of children: N/A   ? Years of education: N/A     Social History Main Topics   ? Smoking status: Never Smoker   ? Smokeless tobacco: Never Used   ? Alcohol use No   ? Drug use: No   ? Sexual activity: Not Asked     Other Topics Concern   ? None     Social History Narrative   ? None       Review of Systems   Constitutional: Positive for fever. Negative for decreased appetite.   HENT: Positive for sore throat. Negative for nasal congestion.    Eyes: Negative.    Respiratory: Negative for cough.    Cardiovascular: Negative.    Gastrointestinal: Positive for abdominal pain. Negative for vomiting and diarrhea.   Genitourinary: Negative.    Musculoskeletal: Negative.    Skin: Negative.    Neurological: Negative.    Psychiatric/Behavioral: Negative.    Allergic/Immunologic: negative.    Endocrine: negative.       Physical Exam     ED Triage Vitals [04/25/17 1211]   BP 123/71   Pulse 118   Resp 20   Temp 37.7 ?C (99.8 ?F)   Temp src Oral   Height 1.702 m

## 2017-04-25 NOTE — ED Provider Notes
Weight 73.5 kg   SpO2 97 %   BMI (Calculated) 25.43             Physical Exam   Constitutional: She is oriented to person, place, and time. She appears well-developed and well-nourished. No distress.   HENT:   Right Ear: External ear normal.   Left Ear: External ear normal.   Mouth/Throat: Posterior oropharyngeal erythema present. No oropharyngeal exudate.       Bilateral tonsil enlargement and erythema   Eyes: Conjunctivae and EOM are normal. Right eye exhibits no discharge. Left eye exhibits no discharge.   Neck: Normal range of motion. Neck supple.   Cardiovascular: Regular rhythm.    No murmur heard.  Tachycardic   Pulmonary/Chest: Effort normal and breath sounds normal. No respiratory distress. She has no wheezes. She has no rales. She exhibits no tenderness.   Abdominal: Soft. Bowel sounds are normal. She exhibits no distension. There is tenderness in the right lower quadrant. There is no rebound.       Tenderness to RLQ, no suprapubic tenderness   Musculoskeletal: She exhibits no edema, tenderness or deformity.   Lymphadenopathy:     She has no cervical adenopathy.   Neurological: She is alert and oriented to person, place, and time.   Skin: Skin is warm and dry.       Differential DDx: Strep throat, tonsillitis, Viral URI    Is this an Emergent Medical Condition? Yes - Severe Pain/Acute Onset of Symptons  409.901 FS  641.19 FS  627.732 (16) FS    ED Workup   Procedures    Labs:  -   POCT URINE PREGNANCY - Normal       Result Value Ref Range    Preg Test, Urine (POC) negative     POCT URINE PREGNANCY - Normal    Preg Test, Urine (POC) Negative  Negative   POCT RAPID STREP A         Imaging (Read by ED Provider):  {Imaging [phone]}      EKG (Read by ED Provider):  {EKG findings:[phone]}        ED Course & Re-Evaluation     ED Course    The patient was examined. She has been seen here multiple times this month. On exam she has enlarged tonsils and is tachycardic. We will get a

## 2017-04-25 NOTE — ED Provider Notes
Weight 73.5 kg   SpO2 97 %   BMI (Calculated) 25.43             Physical Exam   Constitutional: She is oriented to person, place, and time. She appears well-developed and well-nourished. No distress.   HENT:   Right Ear: External ear normal.   Left Ear: External ear normal.   Mouth/Throat: Posterior oropharyngeal erythema present. No oropharyngeal exudate.       Bilateral tonsil enlargement and erythema   Eyes: Conjunctivae and EOM are normal. Right eye exhibits no discharge. Left eye exhibits no discharge.   Neck: Normal range of motion. Neck supple.   Cardiovascular: Regular rhythm.    No murmur heard.  Tachycardic   Pulmonary/Chest: Effort normal and breath sounds normal. No respiratory distress. She has no wheezes. She has no rales. She exhibits no tenderness.   Abdominal: Soft. Bowel sounds are normal. She exhibits no distension. There is tenderness in the right lower quadrant. There is no rebound.       Tenderness to RLQ, no suprapubic tenderness   Musculoskeletal: She exhibits no edema, tenderness or deformity.   Lymphadenopathy:     She has no cervical adenopathy.   Neurological: She is alert and oriented to person, place, and time.   Skin: Skin is warm and dry.       Differential DDx: Strep throat, tonsillitis, Viral URI    Is this an Emergent Medical Condition? Yes - Severe Pain/Acute Onset of Symptons  409.901 FS  641.19 FS  627.732 (16) FS    ED Workup   Procedures    Labs:  - - No data to display      Imaging (Read by ED Provider):  {Imaging findings:[phone]}      EKG (Read by ED Provider):  {EKG [phone]}        ED Course & Re-Evaluation     ED Course    The patient was examined. She has been seen here multiple times this month. On exam she has enlarged tonsils and is tachycardic. We will get a rapid strep and pregnancy test at this time. She is having RLQ pain and declined a vaginal exam. I will discuss the case with Dr. Denice BorsLuten. Earnest BaileyMichael Haynes, DO 1:29 PM 04/25/2017        MDM

## 2017-04-25 NOTE — ED Provider Notes
rapid strep and pregnancy test at this time. She is having RLQ pain and declined a vaginal exam. I will discuss the case with Dr. Denice BorsLuten. Earnest BaileyMichael Haynes, DO 1:29 PM 04/25/2017    Patients Gonorrhea positive from 5/11 with no treatment. She continues to deny vaginal symptoms and does not want a vaginal exam. We will give Rocephin, Azithromycin, Metronidazole, and Zofran at this time. Earnest BaileyMichael Haynes, DO 2:22 PM 04/25/2017    Patient was given antibioitcs. We will call case management. The patient needs to leave to get a back at her group home. We will see if case management can call to reserve her a bed. Earnest BaileyMichael Haynes, DO 3:12 PM 04/25/2017    MDM   Decide to obtain history from someone other than the patient: {SH ED Lamonte SakaiJX MDM - OBTAIN ZOXWRUE:45409}HISTORY:28378}    Decide to obtain previous medical records: [facility]{SH ED Lamonte SakaiJX MDM - PREVIOUS MED REC - NO WJX:91478}YES:28380}    Clinical Lab Test(s): {SH ED Lamonte SakaiJX MDM ORDERED AND REVIEWED:28124}    Diagnostic Tests (Radiology, EKG): {SH ED Lamonte SakaiJX MDM ORDERED AND REVIEWED:28124}    Independent Visualization (ED US, Wet Prep, Other): {SH ED Lamonte SakaiJX MDM NO YES [id number]:86578}WILDCARD:26444}    Discussed patient with NON-ED Provider: {SH ED Lamonte SakaiJX MDM - ANOTHER PROVIDER:28381}      ED Disposition   ED Disposition: No ED Disposition Set      ED Clinical Impression   ED Clinical Impression:   Gonorrhea  Tonsillitis      ED Patient Status   Patient Status:   {[facility]:[phone]}        ED Medical Evaluation Initiated   Medical Evaluation Initiated:  Yes, filed at 04/25/17 1305  by Earnest BaileyHaynes, Michael, DO

## 2017-04-25 NOTE — ED Triage Notes
Pt ambulates to triage w/ steady gait. Pt reports having a fever before she arrived 102 f. Pt denies taking medicine. Pt reports dcreased appetite and nausea. Pt reports body aches, chills, right sided hip pelvic pain. Pt also reports blurred vision. Tachycardic. NAD

## 2017-12-11 ENCOUNTER — Other Ambulatory Visit: Payer: Self-pay

## 2017-12-11 ENCOUNTER — Emergency Department
Admission: EM | Admit: 2017-12-11 | Discharge: 2017-12-11 | Disposition: A | Discharge status: Home / Self Care | Payer: Medicaid Other | Attending: Emergency Medicine | Admitting: Emergency Medicine

## 2017-12-11 ENCOUNTER — Encounter: Payer: Self-pay | Admitting: Emergency Medicine

## 2017-12-11 DIAGNOSIS — T481X5A Adverse effect of skeletal muscle relaxants [neuromuscular blocking agents], initial encounter: Secondary | ICD-10-CM | POA: Insufficient documentation

## 2017-12-11 DIAGNOSIS — Y658 Other specified misadventures during surgical and medical care: Secondary | ICD-10-CM | POA: Insufficient documentation

## 2017-12-11 DIAGNOSIS — Y639 Failure in dosage during unspecified surgical and medical care: Secondary | ICD-10-CM | POA: Insufficient documentation

## 2017-12-11 DIAGNOSIS — R5383 Other fatigue: Secondary | ICD-10-CM | POA: Diagnosis not present

## 2017-12-11 DIAGNOSIS — T50901A Poisoning by unspecified drugs, medicaments and biological substances, accidental (unintentional), initial encounter: Secondary | ICD-10-CM

## 2017-12-11 DIAGNOSIS — T50905A Adverse effect of unspecified drugs, medicaments and biological substances, initial encounter: Secondary | ICD-10-CM

## 2017-12-11 HISTORY — DX: Unspecified convulsions: R56.9

## 2017-12-11 MED ORDER — SODIUM CHLORIDE 0.9 % IV BOLUS (SEPSIS)
1000.0000 mL | Freq: Once | INTRAVENOUS | Status: AC
  Administered 2017-12-11: 1000 mL via INTRAVENOUS

## 2017-12-11 NOTE — ED Notes (Signed)
IV fluids finished  Pt remains sleepy but with answer questions

## 2017-12-11 NOTE — ED Notes (Signed)
Spoke with her great aunt  States that her foster mother (diane) would be able to come get her after 5 pm  Pt made aware

## 2017-12-11 NOTE — ED Notes (Signed)
FIRST NURSE NOTE: pt comes into the ED via EMS from school with c/o taking an unknown pill that someone gave her on the bus this morning and now having feeling of sleepy, limbs feeling heavy. With the pill identifier , identified as flexeril with pill identifier.

## 2017-12-11 NOTE — ED Triage Notes (Signed)
Says a friend gave her 2 pills  At the bus stop this am and she thought they were tylenol.  Have been identified as flexeril. She says she started feeling funny.  Is oriented.  Answers appropriately, but slowly

## 2017-12-11 NOTE — ED Notes (Signed)
Presents via ems from school  States her friend gave her 2 "peach"pills for headache and told her it was tylenol   The pills were flexeril .the patient is very sleepy on arrival but responds slowly but approp.

## 2017-12-11 NOTE — ED Provider Notes (Signed)
[facility] Emergency Department Provider Note  ____________________________________________  Time seen: Approximately 11:48 AM  I have reviewed the triage vital signs and the nursing notes.   HISTORY  Chief Complaint Altered Mental Status    HPI Patricia Richmond is a 22 y.o. female that presents to the emergency department for evaluation of feeling sick after taking 2 Flexeril.  Patient was at the bus stop for school and asked a friend for Tylenol.  Patient was given 2-5 mg pills of Flexeril instead of Tylenol. Patient felt shaky so she was sent to the ER. She currently feels tired and states that her thumb hurts.   Past Medical History:  Diagnosis Date  . Seizures (HCC)     There are no active problems to display for this patient.   History reviewed. No pertinent surgical history.  Prior to Admission medications   Not on File    Allergies Chocolate and Pineapple  No family history on file.  Social History Social History   Tobacco Use  . Smoking status: Never Smoker  . Smokeless tobacco: Never Used  Substance Use Topics  . Alcohol use: Not on file  . Drug use: Not on file     Review of Systems  Cardiovascular: No chest pain. Respiratory: No SOB. Gastrointestinal: No abdominal pain.  No nausea, no vomiting.  Skin: Negative for rash, abrasions, lacerations, ecchymosis.   ____________________________________________   PHYSICAL EXAM:  VITAL SIGNS: ED Triage Vitals  Enc Vitals Group     BP 12/11/17 1033 119/76     Pulse Rate 12/11/17 1033 74     Resp 12/11/17 1033 14     Temp 12/11/17 1033 97.8 F (36.6 C)     Temp Source 12/11/17 1033 Oral     SpO2 12/11/17 1033 99 %     Weight 12/11/17 1034 150 lb (68 kg)     Height 12/11/17 1034 5\' 7"  (1.702 m)     Head Circumference --      Peak Flow --      Pain Score 12/11/17 1033 6     Pain Loc --      Pain Edu? --      Excl. in GC? --      Constitutional: Oriented x4.   Sleepy. Eyes: Conjunctivae are normal. PERRL. EOMI. Head: Atraumatic. ENT:      Ears:      Nose: No congestion/rhinnorhea.      Mouth/Throat: Mucous membranes are moist.  Neck: No stridor.  Cardiovascular: Normal rate, regular rhythm.  Good peripheral circulation. Respiratory: Normal respiratory effort without tachypnea or retractions. Lungs CTAB. Good air entry to the bases with no decreased or absent breath sounds. Musculoskeletal: Full range of motion to all extremities. No gross deformities appreciated. Neurologic:  Normal speech and language. No gross focal neurologic deficits are appreciated.  Skin:  Skin is warm, dry and intact. No rash noted.   ____________________________________________   LABS (all labs ordered are listed, but only abnormal results are displayed)  Labs Reviewed - No data to display ____________________________________________  EKG   ____________________________________________  RADIOLOGY   No results found.  ____________________________________________    PROCEDURES  Procedure(s) performed:    Procedures    Medications  sodium chloride 0.9 % bolus 1,000 mL (0 mLs Intravenous Stopped 12/11/17 1443)     ____________________________________________   INITIAL IMPRESSION / ASSESSMENT AND PLAN / ED COURSE  Pertinent labs & imaging results that were available during my care of the patient were reviewed  by me and considered in my medical decision making (see chart for details).  Review of the [place] CSRS was performed in accordance of the [facility] prior to dispensing any controlled drugs.  Patient presented to the emergency department for evaluation of not feeling well after taking Flexeril when meaning to take Tylenol.  Patient was given a bag of IV fluids.  After sleeping in the ED and receiving fluids, she began to feel better.  Before leaving the emergency department, she was eating crackers and peanut butter, drinking cranberry juice and  behaving as herself. She was asking to go back to school today. Education about taking other peoples medications was provided. Foster parent Diane picked her up and will be with her tonight.  Patient is to follow up with PCP as directed. Patient is given ED precautions to return to the ED for any worsening or new symptoms.     ____________________________________________  FINAL CLINICAL IMPRESSION(S) / ED DIAGNOSES  Final diagnoses:  Adverse effect of drug, initial encounter  Ingestion of unknown medication, accidental or unintentional, initial encounter      NEW MEDICATIONS STARTED DURING THIS VISIT:  ED Discharge Orders    None          This chart was dictated using voice recognition software/Dragon. Despite best efforts to proofread, errors can occur which can change the meaning. Any change was purely unintentional.    Enid DerryWagner, Edythe Riches, PA-C 12/11/17 1726    Emily FilbertWilliams, Jonathan E, MD 12/12/17 [phone]

## 2018-12-04 ENCOUNTER — Encounter: Payer: Self-pay | Admitting: Emergency Medicine

## 2018-12-04 ENCOUNTER — Other Ambulatory Visit: Payer: Self-pay

## 2018-12-04 ENCOUNTER — Emergency Department
Admission: EM | Admit: 2018-12-04 | Discharge: 2018-12-04 | Disposition: A | Discharge status: Home / Self Care | Payer: Medicaid Other | Attending: Emergency Medicine | Admitting: Emergency Medicine

## 2018-12-04 DIAGNOSIS — G40909 Epilepsy, unspecified, not intractable, without status epilepticus: Secondary | ICD-10-CM | POA: Diagnosis not present

## 2018-12-04 DIAGNOSIS — Z79899 Other long term (current) drug therapy: Secondary | ICD-10-CM | POA: Insufficient documentation

## 2018-12-04 DIAGNOSIS — R259 Unspecified abnormal involuntary movements: Secondary | ICD-10-CM | POA: Diagnosis present

## 2018-12-04 HISTORY — DX: Oppositional defiant disorder: F91.3

## 2018-12-04 HISTORY — DX: Depression, unspecified: F32.A

## 2018-12-04 HISTORY — DX: Patient's other noncompliance with medication regimen: Z91.14

## 2018-12-04 HISTORY — DX: Attention-deficit hyperactivity disorder, unspecified type: F90.9

## 2018-12-04 HISTORY — DX: Post-traumatic stress disorder, unspecified: F43.10

## 2018-12-04 HISTORY — DX: Borderline personality disorder: F60.3

## 2018-12-04 HISTORY — DX: Patient's other noncompliance with medication regimen for other reason: Z91.148

## 2018-12-04 HISTORY — DX: Major depressive disorder, single episode, unspecified: F32.9

## 2018-12-04 HISTORY — DX: Anxiety disorder, unspecified: F41.9

## 2018-12-04 HISTORY — DX: Bipolar disorder, unspecified: F31.9

## 2018-12-04 LAB — URINE DRUG SCREEN, QUALITATIVE (ARMC ONLY)
Amphetamines, Ur Screen: NOT DETECTED
BENZODIAZEPINE, UR SCRN: NOT DETECTED
Barbiturates, Ur Screen: NOT DETECTED
CANNABINOID 50 NG, UR ~~LOC~~: POSITIVE — AB
COCAINE METABOLITE, UR ~~LOC~~: NOT DETECTED
MDMA (Ecstasy)Ur Screen: NOT DETECTED
Methadone Scn, Ur: NOT DETECTED
OPIATE, UR SCREEN: NOT DETECTED
Phencyclidine (PCP) Ur S: NOT DETECTED
Tricyclic, Ur Screen: NOT DETECTED

## 2018-12-04 LAB — CBC WITH DIFFERENTIAL/PLATELET
ABS IMMATURE GRANULOCYTES: 0 10*3/uL (ref 0.00–0.07)
BASOS PCT: 1 %
Basophils Absolute: 0 10*3/uL (ref 0.0–0.1)
Eosinophils Absolute: 0.1 10*3/uL (ref 0.0–0.5)
Eosinophils Relative: 3 %
HEMATOCRIT: 38.8 % (ref 36.0–46.0)
HEMOGLOBIN: 12 g/dL (ref 12.0–15.0)
IMMATURE GRANULOCYTES: 0 %
LYMPHS ABS: 2.4 10*3/uL (ref 0.7–4.0)
LYMPHS PCT: 58 %
MCH: 24.9 pg — AB (ref 26.0–34.0)
MCHC: 30.9 g/dL (ref 30.0–36.0)
MCV: 80.7 fL (ref 80.0–100.0)
MONO ABS: 0.3 10*3/uL (ref 0.1–1.0)
Monocytes Relative: 7 %
NEUTROS PCT: 31 %
Neutro Abs: 1.3 10*3/uL — ABNORMAL LOW (ref 1.7–7.7)
PLATELETS: 248 10*3/uL (ref 150–400)
RBC: 4.81 MIL/uL (ref 3.87–5.11)
RDW: 17.2 % — ABNORMAL HIGH (ref 11.5–15.5)
WBC: 4 10*3/uL (ref 4.0–10.5)
nRBC: 0 % (ref 0.0–0.2)

## 2018-12-04 LAB — COMPREHENSIVE METABOLIC PANEL
ALK PHOS: 38 U/L (ref 38–126)
ALT: 9 U/L (ref 0–44)
AST: 16 U/L (ref 15–41)
Albumin: 4 g/dL (ref 3.5–5.0)
Anion gap: 8 (ref 5–15)
BUN: 9 mg/dL (ref 6–20)
CALCIUM: 9.2 mg/dL (ref 8.9–10.3)
CO2: 24 mmol/L (ref 22–32)
CREATININE: 0.67 mg/dL (ref 0.44–1.00)
Chloride: 109 mmol/L (ref 98–111)
Glucose, Bld: 107 mg/dL — ABNORMAL HIGH (ref 70–99)
Potassium: 3.6 mmol/L (ref 3.5–5.1)
Sodium: 141 mmol/L (ref 135–145)
Total Bilirubin: 0.4 mg/dL (ref 0.3–1.2)
Total Protein: 6.6 g/dL (ref 6.5–8.1)

## 2018-12-04 LAB — URINALYSIS, ROUTINE W REFLEX MICROSCOPIC
BILIRUBIN URINE: NEGATIVE
Glucose, UA: NEGATIVE mg/dL
HGB URINE DIPSTICK: NEGATIVE
KETONES UR: 5 mg/dL — AB
Leukocytes, UA: NEGATIVE
Nitrite: NEGATIVE
PH: 6 (ref 5.0–8.0)
Protein, ur: NEGATIVE mg/dL
SPECIFIC GRAVITY, URINE: 1.029 (ref 1.005–1.030)

## 2018-12-04 LAB — POCT PREGNANCY, URINE: Preg Test, Ur: NEGATIVE

## 2018-12-04 LAB — LACTIC ACID, PLASMA: Lactic Acid, Venous: 0.9 mmol/L (ref 0.5–1.9)

## 2018-12-04 LAB — LIPASE, BLOOD: Lipase: 32 U/L (ref 11–51)

## 2018-12-04 LAB — ETHANOL: Alcohol, Ethyl (B): <10 mg/dL (ref <10)

## 2018-12-04 LAB — MAGNESIUM: Magnesium: 1.9 mg/dL (ref 1.7–2.4)

## 2018-12-04 MED ORDER — LEVETIRACETAM IN NACL 1000 MG/100ML IV SOLN
1000.0000 mg | INTRAVENOUS | Status: AC
  Administered 2018-12-04: 1000 mg via INTRAVENOUS
  Filled 2018-12-04: qty 100

## 2018-12-04 MED ORDER — LEVETIRACETAM 500 MG PO TABS: 500.0000 mg | ORAL_TABLET | Freq: Two times a day (BID) | ORAL | 5 refills | Status: AC

## 2018-12-04 NOTE — Discharge Instructions (Signed)

## 2018-12-04 NOTE — ED Triage Notes (Addendum)
Pt to triage via w/c, awake but nonverbal; pt brought in by female who reports pt off Keppra x 3 days; with encouragement, pt answers questions in simple one-word answers or by shaking "yes" or "no"; 1st nurse reports pt walked in lobby without difficulty; pt indicates pain behind right eye

## 2018-12-04 NOTE — ED Notes (Signed)
Pt states having a seizure in lobby of ED. Pt has pain behind her right eye and her vision is blurry. God mother is at bedside
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
## 2018-12-28 ENCOUNTER — Emergency Department
Admission: EM | Admit: 2018-12-28 | Discharge: 2018-12-29 | Disposition: A | Discharge status: Home / Self Care | Payer: Medicaid Other | Attending: Emergency Medicine | Admitting: Emergency Medicine

## 2018-12-28 DIAGNOSIS — Z79899 Other long term (current) drug therapy: Secondary | ICD-10-CM | POA: Diagnosis not present

## 2018-12-28 DIAGNOSIS — R102 Pelvic and perineal pain: Secondary | ICD-10-CM | POA: Diagnosis not present

## 2018-12-28 DIAGNOSIS — F909 Attention-deficit hyperactivity disorder, unspecified type: Secondary | ICD-10-CM | POA: Diagnosis not present

## 2018-12-28 DIAGNOSIS — N83201 Unspecified ovarian cyst, right side: Secondary | ICD-10-CM | POA: Diagnosis not present

## 2018-12-28 LAB — CBC
HCT: 36.8 % (ref 36.0–46.0)
HEMOGLOBIN: 11.4 g/dL — AB (ref 12.0–15.0)
MCH: 25.3 pg — AB (ref 26.0–34.0)
MCHC: 31 g/dL (ref 30.0–36.0)
MCV: 81.6 fL (ref 80.0–100.0)
Platelets: 309 10*3/uL (ref 150–400)
RBC: 4.51 MIL/uL (ref 3.87–5.11)
RDW: 18.2 % — ABNORMAL HIGH (ref 11.5–15.5)
WBC: 5.4 10*3/uL (ref 4.0–10.5)
nRBC: 0 % (ref 0.0–0.2)

## 2018-12-28 NOTE — ED Triage Notes (Signed)
Patient c/o RLQ pelvic pain beginning yesterday. Patient c/o bleeding with large clumps.

## 2018-12-29 ENCOUNTER — Emergency Department: Payer: Medicaid Other

## 2018-12-29 LAB — HEPATIC FUNCTION PANEL
ALT: 9 U/L (ref 0–44)
AST: 22 U/L (ref 15–41)
Albumin: 4.6 g/dL (ref 3.5–5.0)
Alkaline Phosphatase: 47 U/L (ref 38–126)
BILIRUBIN TOTAL: 0.5 mg/dL (ref 0.3–1.2)
Bilirubin, Direct: <0.1 mg/dL (ref 0.0–0.2)
Total Protein: 7.4 g/dL (ref 6.5–8.1)

## 2018-12-29 LAB — BASIC METABOLIC PANEL
Anion gap: 6 (ref 5–15)
BUN: 11 mg/dL (ref 6–20)
CHLORIDE: 109 mmol/L (ref 98–111)
CO2: 24 mmol/L (ref 22–32)
Calcium: 9.1 mg/dL (ref 8.9–10.3)
Creatinine, Ser: 0.57 mg/dL (ref 0.44–1.00)
Glucose, Bld: 83 mg/dL (ref 70–99)
POTASSIUM: 3.6 mmol/L (ref 3.5–5.1)
SODIUM: 139 mmol/L (ref 135–145)

## 2018-12-29 LAB — LIPASE, BLOOD: Lipase: 36 U/L (ref 11–51)

## 2018-12-29 LAB — HCG, QUANTITATIVE, PREGNANCY: hCG, Beta Chain, Quant, S: <1 m[IU]/mL (ref <5)

## 2018-12-29 LAB — PREGNANCY, URINE: Preg Test, Ur: NEGATIVE

## 2018-12-29 LAB — URINALYSIS, COMPLETE (UACMP) WITH MICROSCOPIC
Bacteria, UA: NONE SEEN
Bilirubin Urine: NEGATIVE
Glucose, UA: NEGATIVE mg/dL
KETONES UR: 20 mg/dL — AB
Leukocytes, UA: NEGATIVE
NITRITE: NEGATIVE
PH: 5 (ref 5.0–8.0)
PROTEIN: 30 mg/dL — AB
Specific Gravity, Urine: 1.02 (ref 1.005–1.030)

## 2018-12-29 MED ORDER — IBUPROFEN 600 MG PO TABS
600.0000 mg | ORAL_TABLET | Freq: Once | ORAL | Status: AC
  Administered 2018-12-29: 600 mg via ORAL
  Filled 2018-12-29: qty 1

## 2018-12-29 NOTE — ED Provider Notes (Signed)
[facility] Emergency Department Provider Note ____________   First MD Initiated Contact with Patient 12/28/18 2350     (approximate)  I have reviewed the triage vital signs and the nursing notes.   HISTORY  Chief Complaint Pelvic Pain    HPI Patricia Richmond is a 23 y.o. female with below list of chronic medical and psychiatric conditions presents to the emergency department with right pelvic discomfort and vaginal bleeding which patient stated started yesterday.  Patient states that she has been passing "large clumps".  Patient states her last menstrual period was December 19.  She does admit to sexual activity and possibility of pregnancy.  Patient denies any nausea vomiting or fever.  Patient denies any diarrhea constipation.  Patient denies any urinary symptoms.   Past Medical History:  Diagnosis Date  . ADHD   . Anxiety   . Bipolar I disorder (HCC)   . Borderline personality disorder (HCC)   . Depression   . History of medication noncompliance   . Oppositional defiant disorder   . PTSD (post-traumatic stress disorder)   . Seizures (HCC)     There are no active problems to display for this patient.   History reviewed. No pertinent surgical history.  Prior to Admission medications   Medication Sig Start Date End Date Taking? Authorizing Provider  levETIRAcetam (KEPPRA) 500 MG tablet Take 1 tablet (500 mg total) by mouth 2 (two) times daily. 12/04/18 01/03/19  Loleta RoseForbach, Cory, MD    Allergies Chocolate and Pineapple  No family history on file.  Social History Social History   Tobacco Use  . Smoking status: Never Smoker  . Smokeless tobacco: Never Used  Substance Use Topics  . Alcohol use: Not Currently  . Drug use: Not on file    Review of Systems Constitutional: No fever/chills Eyes: No visual changes. ENT: No sore throat. Cardiovascular: Denies chest pain. Respiratory: Denies shortness of breath. Gastrointestinal: No abdominal  pain.  No nausea, no vomiting.  No diarrhea.  No constipation. Genitourinary: Negative for dysuria.  Positive for vaginal bleeding and pelvic pain Musculoskeletal: Negative for neck pain.  Negative for back pain. Integumentary: Negative for rash. Neurological: Negative for headaches, focal weakness or numbness.  ____________________________________________   PHYSICAL EXAM:  VITAL SIGNS: ED Triage Vitals  Enc Vitals Group     BP 12/28/18 2312 118/85     Pulse Rate 12/28/18 2312 75     Resp 12/28/18 2312 18     Temp 12/28/18 2312 98.5 F (36.9 C)     Temp src --      SpO2 12/28/18 2312 100 %     Weight 12/28/18 2313 55 kg (121 lb 4.1 oz)     Height --      Head Circumference --      Peak Flow --      Pain Score 12/28/18 2313 7     Pain Loc --      Pain Edu? --      Excl. in GC? --     Constitutional: Alert and oriented. Well appearing and in no acute distress. Eyes: Conjunctivae are normal.  Mouth/Throat: Mucous membranes are moist.  Oropharynx non-erythematous. Neck: No stridor.   Cardiovascular: Normal rate, regular rhythm. Good peripheral circulation. Grossly normal heart sounds. Respiratory: Normal respiratory effort.  No retractions. Lungs CTAB. Gastrointestinal: Soft and nontender. No distention.  Musculoskeletal: No lower extremity tenderness nor edema. No gross deformities of extremities. Neurologic:  Normal speech and language. No gross focal  neurologic deficits are appreciated.  Skin:  Skin is warm, dry and intact. No rash noted. Psychiatric: Mood and affect are normal. Speech and behavior are normal.  ____________________________________________   LABS (all labs ordered are listed, but only abnormal results are displayed)  Labs Reviewed  CBC - Abnormal; Notable for the following components:      Result Value   Hemoglobin 11.4 (*)    MCH 25.3 (*)    RDW 18.2 (*)    All other components within normal limits  URINALYSIS, COMPLETE (UACMP) WITH MICROSCOPIC -  Abnormal; Notable for the following components:   Color, Urine YELLOW (*)    APPearance HAZY (*)    Hgb urine dipstick LARGE (*)    Ketones, ur 20 (*)    Protein, ur 30 (*)    RBC / HPF >50 (*)    All other components within normal limits  BASIC METABOLIC PANEL  HEPATIC FUNCTION PANEL  LIPASE, BLOOD  HCG, QUANTITATIVE, PREGNANCY  PREGNANCY, URINE  POC URINE PREG, ED  POC URINE PREG, ED  I-STAT BETA HCG BLOOD, ED ([facility], [facility], [facility] ONLY)   _______________________________  RADIOLOGY I, Macon Dewayne ShorterN Laycie Schriner, personally viewed and evaluated these images (plain radiographs) as part of my medical decision making, as well as reviewing the written report by the radiologist.  ED MD interpretation: Right ovarian cyst noted on ultrasound.  Official radiology report(s): [place] Pelvis Transvanginal Non-ob (tv Only)  Result Date: 12/29/2018 CLINICAL DATA:  Right-sided pelvic pain EXAM: TRANSABDOMINAL AND TRANSVAGINAL ULTRASOUND OF PELVIS TECHNIQUE: Both transabdominal and transvaginal ultrasound examinations of the pelvis were performed. Transabdominal technique was performed for global imaging of the pelvis including uterus, ovaries, adnexal regions, and pelvic cul-de-sac. It was necessary to proceed with endovaginal exam following the transabdominal exam to visualize the uterus, endometrium, ovaries and adnexa. COMPARISON:  None FINDINGS: Uterus Measurements: 7.2 x 3.5 x 4.9 cm = volume: 64.2 mL. No fibroids or other mass visualized. The uterus is retroverted. Endometrium Thickness: 3.2 mm.  No focal abnormality visualized. Right ovary Measurements: 5.1 x 2.6 x 2.8 cm = volume: 19.7 mL. There is a 2.8 x 2.3 x 2.3 cm well-circumscribed hypoechoic complex cyst associated with the right ovary without worrisome features but with internal debris. Left ovary Measurements: 3 x 1.5 x 2.3 cm = volume: 5.5 mL. Normal appearance/no adnexal mass. Other findings No abnormal free fluid. IMPRESSION: 1. Complex right ovarian cyst  with low-level internal echoes likely representing internal debris. No worrisome features are identified. This measures 2.8 x 2.3 x 2 3 cm. 2. The pelvis is otherwise unremarkable in appearance. Electronically Signed   By: Tollie Ethavid  Kwon M.D.   On: 12/29/2018 01:16   [place] Pelvis (transabdominal Only)  Result Date: 12/29/2018 CLINICAL DATA:  Right-sided pelvic pain EXAM: TRANSABDOMINAL AND TRANSVAGINAL ULTRASOUND OF PELVIS TECHNIQUE: Both transabdominal and transvaginal ultrasound examinations of the pelvis were performed. Transabdominal technique was performed for global imaging of the pelvis including uterus, ovaries, adnexal regions, and pelvic cul-de-sac. It was necessary to proceed with endovaginal exam following the transabdominal exam to visualize the uterus, endometrium, ovaries and adnexa. COMPARISON:  None FINDINGS: Uterus Measurements: 7.2 x 3.5 x 4.9 cm = volume: 64.2 mL. No fibroids or other mass visualized. The uterus is retroverted. Endometrium Thickness: 3.2 mm.  No focal abnormality visualized. Right ovary Measurements: 5.1 x 2.6 x 2.8 cm = volume: 19.7 mL. There is a 2.8 x 2.3 x 2.3 cm well-circumscribed hypoechoic complex cyst associated with the right ovary without worrisome  features but with internal debris. Left ovary Measurements: 3 x 1.5 x 2.3 cm = volume: 5.5 mL. Normal appearance/no adnexal mass. Other findings No abnormal free fluid. IMPRESSION: 1. Complex right ovarian cyst with low-level internal echoes likely representing internal debris. No worrisome features are identified. This measures 2.8 x 2.3 x 2 3 cm. 2. The pelvis is otherwise unremarkable in appearance. Electronically Signed   By: Tollie Eth M.D.   On: 12/29/2018 01:16     Procedures   ____________________________________________   INITIAL IMPRESSION / ASSESSMENT AND PLAN / ED COURSE  As part of my medical decision making, I reviewed the following data within the electronic MEDICAL RECORD NUMBER  -year-old female  presenting with above-stated history and physical exam concerning for possible right ovarian cysts pregnancy menstruation.  Laboratory data revealed a negative hCG.  Ultrasound revealed a right ovarian cyst which is consistent with the patient's symptoms.  ________________________  FINAL CLINICAL IMPRESSION(S) / ED DIAGNOSES  Final diagnoses:  Pelvic pain  Right ovarian cyst     MEDICATIONS GIVEN DURING THIS VISIT:  Medications - No data to display   ED Discharge Orders    None       Note:  This document was prepared using Dragon voice recognition software and may include unintentional dictation errors.    Darci Current, MD 12/29/18 [phone]

## 2018-12-29 NOTE — ED Notes (Signed)
Pt returned from ultrasound

## 2018-12-29 NOTE — ED Notes (Signed)
Patient transported to Ultrasound 

## 2018-12-29 NOTE — ED Notes (Signed)
Patient returned from ultrasound.

## 2019-01-28 ENCOUNTER — Encounter: Payer: Self-pay | Admitting: Emergency Medicine

## 2019-01-28 ENCOUNTER — Other Ambulatory Visit: Payer: Self-pay

## 2019-01-28 DIAGNOSIS — R109 Unspecified abdominal pain: Secondary | ICD-10-CM | POA: Diagnosis present

## 2019-01-28 DIAGNOSIS — B9689 Other specified bacterial agents as the cause of diseases classified elsewhere: Secondary | ICD-10-CM | POA: Insufficient documentation

## 2019-01-28 DIAGNOSIS — N760 Acute vaginitis: Secondary | ICD-10-CM | POA: Diagnosis not present

## 2019-01-28 DIAGNOSIS — Z202 Contact with and (suspected) exposure to infections with a predominantly sexual mode of transmission: Secondary | ICD-10-CM | POA: Insufficient documentation

## 2019-01-28 LAB — CBC WITH DIFFERENTIAL/PLATELET
Abs Immature Granulocytes: 0.02 10*3/uL (ref 0.00–0.07)
Basophils Absolute: 0 10*3/uL (ref 0.0–0.1)
Basophils Relative: 0 %
EOS PCT: 2 %
Eosinophils Absolute: 0.2 10*3/uL (ref 0.0–0.5)
HCT: 36.2 % (ref 36.0–46.0)
HEMOGLOBIN: 11.4 g/dL — AB (ref 12.0–15.0)
Immature Granulocytes: 0 %
Lymphocytes Relative: 34 %
Lymphs Abs: 2.5 10*3/uL (ref 0.7–4.0)
MCH: 25.4 pg — ABNORMAL LOW (ref 26.0–34.0)
MCHC: 31.5 g/dL (ref 30.0–36.0)
MCV: 80.8 fL (ref 80.0–100.0)
Monocytes Absolute: 0.5 10*3/uL (ref 0.1–1.0)
Monocytes Relative: 7 %
Neutro Abs: 4.3 10*3/uL (ref 1.7–7.7)
Neutrophils Relative %: 57 %
Platelets: 277 10*3/uL (ref 150–400)
RBC: 4.48 MIL/uL (ref 3.87–5.11)
RDW: 18.6 % — ABNORMAL HIGH (ref 11.5–15.5)
WBC: 7.5 10*3/uL (ref 4.0–10.5)
nRBC: 0 % (ref 0.0–0.2)

## 2019-01-28 LAB — POCT PREGNANCY, URINE: Preg Test, Ur: NEGATIVE

## 2019-01-28 NOTE — ED Triage Notes (Signed)
Patient ambulatory to triage with steady gait, without difficulty or distress noted; pt reports lower abd pain x 2 days

## 2019-01-29 ENCOUNTER — Emergency Department
Admission: EM | Admit: 2019-01-29 | Discharge: 2019-01-29 | Disposition: A | Discharge status: Home / Self Care | Payer: Medicaid Other | Attending: Emergency Medicine | Admitting: Emergency Medicine

## 2019-01-29 DIAGNOSIS — N760 Acute vaginitis: Secondary | ICD-10-CM

## 2019-01-29 DIAGNOSIS — B9689 Other specified bacterial agents as the cause of diseases classified elsewhere: Secondary | ICD-10-CM

## 2019-01-29 DIAGNOSIS — Z202 Contact with and (suspected) exposure to infections with a predominantly sexual mode of transmission: Secondary | ICD-10-CM

## 2019-01-29 LAB — URINALYSIS, COMPLETE (UACMP) WITH MICROSCOPIC
BILIRUBIN URINE: NEGATIVE
Glucose, UA: NEGATIVE mg/dL
Hgb urine dipstick: NEGATIVE
Ketones, ur: NEGATIVE mg/dL
Nitrite: NEGATIVE
Protein, ur: NEGATIVE mg/dL
Specific Gravity, Urine: 1.02 (ref 1.005–1.030)
pH: 6 (ref 5.0–8.0)

## 2019-01-29 LAB — WET PREP, GENITAL
Sperm: NONE SEEN
Trich, Wet Prep: NONE SEEN
Yeast Wet Prep HPF POC: NONE SEEN

## 2019-01-29 LAB — COMPREHENSIVE METABOLIC PANEL
ALBUMIN: 4.3 g/dL (ref 3.5–5.0)
ALT: 9 U/L (ref 0–44)
AST: 18 U/L (ref 15–41)
Alkaline Phosphatase: 55 U/L (ref 38–126)
Anion gap: 5 (ref 5–15)
BUN: 7 mg/dL (ref 6–20)
CO2: 24 mmol/L (ref 22–32)
Calcium: 9.1 mg/dL (ref 8.9–10.3)
Chloride: 107 mmol/L (ref 98–111)
Creatinine, Ser: 0.68 mg/dL (ref 0.44–1.00)
GFR calc Af Amer: >60 mL/min (ref >60)
GFR calc non Af Amer: >60 mL/min (ref >60)
GLUCOSE: 93 mg/dL (ref 70–99)
Potassium: 3.7 mmol/L (ref 3.5–5.1)
Sodium: 136 mmol/L (ref 135–145)
Total Bilirubin: 0.1 mg/dL — ABNORMAL LOW (ref 0.3–1.2)
Total Protein: 7.1 g/dL (ref 6.5–8.1)

## 2019-01-29 LAB — LIPASE, BLOOD: Lipase: 39 U/L (ref 11–51)

## 2019-01-29 LAB — CHLAMYDIA/NGC RT PCR (ARMC ONLY)
Chlamydia Tr: NOT DETECTED
N gonorrhoeae: NOT DETECTED

## 2019-01-29 MED ORDER — CEFTRIAXONE SODIUM 250 MG IJ SOLR
250.0000 mg | Freq: Once | INTRAMUSCULAR | Status: AC
  Administered 2019-01-29: 250 mg via INTRAMUSCULAR
  Filled 2019-01-29: qty 250

## 2019-01-29 MED ORDER — AZITHROMYCIN 500 MG PO TABS
1000.0000 mg | ORAL_TABLET | Freq: Once | ORAL | Status: AC
  Administered 2019-01-29: 1000 mg via ORAL
  Filled 2019-01-29: qty 2

## 2019-01-29 MED ORDER — ONDANSETRON 4 MG PO TBDP
8.0000 mg | ORAL_TABLET | Freq: Once | ORAL | Status: AC
  Administered 2019-01-29: 8 mg via ORAL
  Filled 2019-01-29: qty 2

## 2019-01-29 MED ORDER — METRONIDAZOLE 500 MG PO TABS
500.0000 mg | ORAL_TABLET | Freq: Once | ORAL | Status: AC
  Administered 2019-01-29: 500 mg via ORAL
  Filled 2019-01-29: qty 1

## 2019-01-29 MED ORDER — METRONIDAZOLE 500 MG PO TABS: 500.0000 mg | ORAL_TABLET | Freq: Two times a day (BID) | ORAL | 0 refills | Status: DC

## 2019-01-29 NOTE — ED Notes (Signed)
No answer when called from lobby x1 

## 2019-01-29 NOTE — ED Notes (Signed)
Pt provided with good RX prescription card and coupon.

## 2019-01-29 NOTE — ED Notes (Signed)
Initial assessment completed. Pt undressed into gown and awaits Ed provider at this time.

## 2019-01-29 NOTE — ED Provider Notes (Signed)
[facility] Emergency Department Provider Note  ____________________________________________   First MD Initiated Contact with Patient 01/29/19 0404     (approximate)  I have reviewed the triage vital signs and the nursing notes.   HISTORY  Chief Complaint Abdominal Pain    HPI Patricia Richmond is a 23 y.o. female with medical and psychiatric history as listed below who presents for evaluation of several days of abdominal pain.  She states that she is also had some "different colored" vaginal discharge.  She denies any pelvic pain.  She reports that she is about 5 or 6 days late to start her period.  The pain she is experiencing feels crampy and seems to radiate from her pelvis into her lower back.  She denies dysuria.  She denies fever/chills, chest pain, shortness of breath, nausea, vomiting, and abdominal pain other than as described above.  Nothing in particular makes her symptoms better nor worse.      Past Medical History:  Diagnosis Date  . ADHD   . Anxiety   . Bipolar I disorder (HCC)   . Borderline personality disorder (HCC)   . Depression   . History of medication noncompliance   . Oppositional defiant disorder   . PTSD (post-traumatic stress disorder)   . Seizures (HCC)     There are no active problems to display for this patient.   History reviewed. No pertinent surgical history.  Prior to Admission medications   Medication Sig Start Date End Date Taking? Authorizing Provider  levETIRAcetam (KEPPRA) 500 MG tablet Take 1 tablet (500 mg total) by mouth 2 (two) times daily. 12/04/18 01/03/19  Loleta Rose, MD  metroNIDAZOLE (FLAGYL) 500 MG tablet Take 1 tablet (500 mg total) by mouth 2 (two) times daily. 01/29/19   Loleta Rose, MD    Allergies Chocolate and Pineapple  No family history on file.  Social History Social History   Tobacco Use  . Smoking status: Never Smoker  . Smokeless tobacco: Never Used  Substance Use Topics  .  Alcohol use: Not Currently  . Drug use: Not on file    Review of Systems Constitutional: No fever/chills Eyes: No visual changes. ENT: No sore throat. Cardiovascular: Denies chest pain. Respiratory: Denies shortness of breath. Gastrointestinal: Abdominal/pelvic pain, no nausea nor vomiting. Genitourinary: Increased vaginal discharge that is sometimes greenish-yellow.  Negative for dysuria. Musculoskeletal: Negative for neck pain.  Negative for back pain. Integumentary: Negative for rash. Neurological: Negative for headaches, focal weakness or numbness.   ____________________________________________   PHYSICAL EXAM:  VITAL SIGNS: ED Triage Vitals  Enc Vitals Group     BP 01/28/19 2326 134/67     Pulse Rate 01/28/19 2326 100     Resp 01/28/19 2326 (!) 22     Temp 01/28/19 2326 98.6 F (37 C)     Temp Source 01/28/19 2326 Oral     SpO2 01/28/19 2326 100 %     Weight 01/28/19 2326 59 kg (130 lb)     Height 01/28/19 2326 1.702 m (5\' 7" )     Head Circumference --      Peak Flow --      Pain Score 01/28/19 2328 5     Pain Loc --      Pain Edu? --      Excl. in GC? --     Constitutional: Alert and oriented. Well appearing and in no acute distress. Eyes: Conjunctivae are normal.  Head: Atraumatic. Nose: No congestion/rhinnorhea. Mouth/Throat: Mucous membranes are  moist. Neck: No stridor.  No meningeal signs.   Cardiovascular: Normal rate, regular rhythm. Good peripheral circulation. Grossly normal heart sounds. Respiratory: Normal respiratory effort.  No retractions. Lungs CTAB. Gastrointestinal: Soft and nontender. No distention.  Genitourinary: Normal external exam.  Copious thick white vaginal discharge throughout the vagina and surrounding the cervix.  There is no blood present.  The cervix is closed and there is no evidence of cervicitis but the patient does have some mild cervical motion tenderness on bimanual exam.  ED chaperone present throughout  exam. Musculoskeletal: No lower extremity tenderness nor edema. No gross deformities of extremities. Neurologic:  Normal speech and language. No gross focal neurologic deficits are appreciated.  Skin:  Skin is warm, dry and intact. No rash noted. Psychiatric: Mood and affect are normal. Speech and behavior are normal.  ____________________________________________   LABS (all labs ordered are listed, but only abnormal results are displayed)  Labs Reviewed  WET PREP, GENITAL - Abnormal; Notable for the following components:      Result Value   Clue Cells Wet Prep HPF POC PRESENT (*)    WBC, Wet Prep HPF POC MODERATE (*)    All other components within normal limits  CBC WITH DIFFERENTIAL/PLATELET - Abnormal; Notable for the following components:   Hemoglobin 11.4 (*)    MCH 25.4 (*)    RDW 18.6 (*)    All other components within normal limits  COMPREHENSIVE METABOLIC PANEL - Abnormal; Notable for the following components:   Total Bilirubin 0.1 (*)    All other components within normal limits  URINALYSIS, COMPLETE (UACMP) WITH MICROSCOPIC - Abnormal; Notable for the following components:   Color, Urine YELLOW (*)    APPearance CLEAR (*)    Leukocytes,Ua SMALL (*)    Bacteria, UA RARE (*)    All other components within normal limits  CHLAMYDIA/NGC RT PCR ([facility] ONLY)  LIPASE, BLOOD  POCT PREGNANCY, URINE   ____________________________________________  EKG  No indication for EKG ____________________________________________  RADIOLOGY   ED MD interpretation: No indication for imaging  Official radiology report(s): No results found.  ____________________________________________   PROCEDURES   Procedure(s) performed (including Critical Care):  Procedures   ____________________________________________   INITIAL IMPRESSION / MDM / ASSESSMENT AND PLAN / ED COURSE  As part of my medical decision making, I reviewed the following data within the electronic  MEDICAL RECORD NUMBER Nursing notes reviewed and incorporated, Labs reviewed , Old chart reviewed and Notes from prior ED visits       Differential diagnosis includes, but is not limited to, STD/PID, ovarian cyst, urinary tract infection, less likely appendicitis.  She has no tenderness to palpation of the abdomen and she admits to unprotected sexual intercourse with at least one partner.  She wants to be treated empirically for gonorrhea and chlamydia and I think that is appropriate.  Wet prep indicates clue cells but no trichomoniasis.  I am prescribing metronidazole 5 mg twice daily for 7 days for the bacterial vaginosis and she received ceftriaxone 250 mg intramuscular and azithromycin 1000 mg p.o. along with Zofran 8 mg by mouth.  She is in no distress with stable vital signs and afebrile.  I gave her my usual customary return precautions.  Clinical Course as of Jan 29 610  Tue Jan 29, 2019  0516 Will treat for BV  Clue Cells Wet Prep HPF POC(!): PRESENT [CF]    Clinical Course User Index [CF] Loleta RoseForbach, Ilma Achee, MD    ____________________________________________  FINAL CLINICAL IMPRESSION(S) /  ED DIAGNOSES  Final diagnoses:  Bacterial vaginosis  Possible exposure to STD     MEDICATIONS GIVEN DURING THIS VISIT:  Medications  cefTRIAXone (ROCEPHIN) injection 250 mg (250 mg Intramuscular Given 01/29/19 0500)  azithromycin (ZITHROMAX) tablet 1,000 mg (1,000 mg Oral Given 01/29/19 0459)  ondansetron (ZOFRAN-ODT) disintegrating tablet 8 mg (8 mg Oral Given 01/29/19 0458)  metroNIDAZOLE (FLAGYL) tablet 500 mg (500 mg Oral Given 01/29/19 0603)     ED Discharge Orders         Ordered    metroNIDAZOLE (FLAGYL) 500 MG tablet  2 times daily     01/29/19 7121           Note:  This document was prepared using Dragon voice recognition software and may include unintentional dictation errors.   Loleta Rose, MD 01/29/19 [phone]

## 2019-01-29 NOTE — Discharge Instructions (Signed)
As we discussed, we have treated you for sexually transmitted diseases, I specifically gonorrhea and chlamydia.  Your test results are not back yet but you have been fully treated, but remember that you can be reexposed if you have unprotected sex with partners who have the infections.  You do have a condition called bacterial vaginosis which is not sexually transmitted and is treated with a week of metronidazole (Flagyl) as prescribed.  Please follow-up with the health department if you have any questions or concerns or return to the emergency department with new or worsening symptoms.

## 2019-01-29 NOTE — ED Notes (Signed)
Pt verbalized understanding of d/c instructions, rx, and f/u care. No further questions at this time. Pt ambulatory to the exit with steady gait.  

## 2019-02-02 ENCOUNTER — Other Ambulatory Visit: Payer: Self-pay

## 2019-02-02 ENCOUNTER — Emergency Department
Admission: EM | Admit: 2019-02-02 | Discharge: 2019-02-02 | Disposition: A | Discharge status: Home / Self Care | Payer: Medicaid Other | Attending: Emergency Medicine | Admitting: Emergency Medicine

## 2019-02-02 ENCOUNTER — Emergency Department: Payer: Medicaid Other

## 2019-02-02 DIAGNOSIS — Z91040 Latex allergy status: Secondary | ICD-10-CM | POA: Diagnosis not present

## 2019-02-02 DIAGNOSIS — Z3491 Encounter for supervision of normal pregnancy, unspecified, first trimester: Secondary | ICD-10-CM | POA: Diagnosis not present

## 2019-02-02 DIAGNOSIS — R1030 Lower abdominal pain, unspecified: Secondary | ICD-10-CM | POA: Insufficient documentation

## 2019-02-02 DIAGNOSIS — O26891 Other specified pregnancy related conditions, first trimester: Secondary | ICD-10-CM | POA: Insufficient documentation

## 2019-02-02 DIAGNOSIS — Z3A01 Less than 8 weeks gestation of pregnancy: Secondary | ICD-10-CM | POA: Diagnosis not present

## 2019-02-02 LAB — HCG, QUANTITATIVE, PREGNANCY: hCG, Beta Chain, Quant, S: 11676 m[IU]/mL — ABNORMAL HIGH (ref <5)

## 2019-02-02 LAB — POCT PREGNANCY, URINE: Preg Test, Ur: POSITIVE — AB

## 2019-02-02 LAB — ABO/RH: ABO/RH(D): O POS

## 2019-02-02 NOTE — ED Notes (Signed)
MD at bedside. 

## 2019-02-02 NOTE — Discharge Instructions (Addendum)
Please make sure you avoid smoking and alcohol and take prenatal vitamins

## 2019-02-02 NOTE — ED Provider Notes (Signed)
[facility] Emergency Department Provider Note   ____________________________________________    I have reviewed the triage vital signs and the nursing notes.   HISTORY  Chief Complaint Abdominal Pain     HPI Patricia Richmond is a 23 y.o. female with a history as noted below who presents today for evaluation.  Recently seen on the 25th with thorough evaluation in the emergency department for vague lower abdominal discomfort.  Patient reports she took a pregnancy test yesterday which was positive so she here for repeat evaluation.  No vaginal bleeding.  No fevers or chills.   Past Medical History:  Diagnosis Date  . ADHD   . Anxiety   . Bipolar I disorder (HCC)   . Borderline personality disorder (HCC)   . Depression   . History of medication noncompliance   . Oppositional defiant disorder   . PTSD (post-traumatic stress disorder)   . Seizures (HCC)     There are no active problems to display for this patient.   No past surgical history on file.  Prior to Admission medications   Medication Sig Start Date End Date Taking? Authorizing Provider  levETIRAcetam (KEPPRA) 500 MG tablet Take 1 tablet (500 mg total) by mouth 2 (two) times daily. 12/04/18 02/02/19 Yes Loleta Rose, MD     Allergies Chocolate; Latex; Pineapple; and Risperidone  No family history on file.  Social History Social History   Tobacco Use  . Smoking status: Never Smoker  . Smokeless tobacco: Never Used  Substance Use Topics  . Alcohol use: Not Currently  . Drug use: Not on file    Review of Systems  Constitutional: No fever/chills Eyes: No visual changes.  ENT: No sore throat. Cardiovascular: Denies chest pain. Respiratory: Denies shortness of breath. Gastrointestinal: As above Genitourinary: Negative for dysuria. Musculoskeletal: Negative for back pain. Skin: Negative for rash. Neurological: Negative for  headaches   ____________________________________________   PHYSICAL EXAM:  VITAL SIGNS: ED Triage Vitals  Enc Vitals Group     BP 02/02/19 0729 122/83     Pulse Rate 02/02/19 0729 89     Resp 02/02/19 0729 18     Temp 02/02/19 0729 98 F (36.7 C)     Temp Source 02/02/19 0729 Esophageal     SpO2 02/02/19 0729 100 %     Weight 02/02/19 0730 59 kg (130 lb)     Height 02/02/19 0730 1.702 m (5\' 7" )     Head Circumference --      Peak Flow --      Pain Score 02/02/19 0730 5     Pain Loc --      Pain Edu? --      Excl. in GC? --     Constitutional: Alert and oriented.  Eyes: Conjunctivae are normal.   Mouth/Throat: Mucous membranes are moist.   Neck:  Painless ROM Cardiovascular: Normal rate, regular rhythm. Grossly normal heart sounds.  Good peripheral circulation. Respiratory: Normal respiratory effort.  No retractions.  Gastrointestinal: Soft and nontender. No distention.  No CVA tenderness.  Musculoskeletal:  Warm and well perfused Neurologic:  Normal speech and language. No gross focal neurologic deficits are appreciated.  Skin:  Skin is warm, dry and intact. No rash noted. Psychiatric: Mood and affect are normal. Speech and behavior are normal.  ____________________________________________   LABS (all labs ordered are listed, but only abnormal results are displayed)  Labs Reviewed  HCG, QUANTITATIVE, PREGNANCY - Abnormal; Notable for the following components:  Result Value   hCG, Beta Chain, Quant, S 11,676 (*)    All other components within normal limits  POCT PREGNANCY, URINE - Abnormal; Notable for the following components:   Preg Test, Ur POSITIVE (*)    All other components within normal limits  POC URINE PREG, ED  ABO/RH   ____________________________________________  EKG  None ____________________________________________  RADIOLOGY  Ultrasound demonstrates 5-week 6-day gestational  sac ____________________________________________   PROCEDURES  Procedure(s) performed: No  Procedures   Critical Care performed: No ____________________________________________   INITIAL IMPRESSION / ASSESSMENT AND PLAN / ED COURSE  Pertinent labs & imaging results that were available during my care of the patient were reviewed by me and considered in my medical decision making (see chart for details).  Patient presents as above, we will repeat urine POCT.  Exam is quite reassuring vitals unremarkable.   Our POCT here is positive, will send beta  Beta elevated, patient received ultrasound which demonstrates gestational sac, appropriate for discharge at this time with follow-up with health department   ____________________________________________   FINAL CLINICAL IMPRESSION(S) / ED DIAGNOSES  Final diagnoses:  Less than [redacted] weeks gestation of pregnancy        Note:  This document was prepared using Dragon voice recognition software and may include unintentional dictation errors.   Jene Every, MD 02/02/19 1158

## 2019-02-02 NOTE — ED Triage Notes (Signed)
Mid abdominal pain x 5 days. Has been seen here during that time.

## 2019-02-02 NOTE — ED Notes (Signed)
Pt remains in [place] at this time, will update VS when pt returns.

## 2019-02-02 NOTE — ED Notes (Signed)
Pt given urine specimen cup. Ambulatory to toilet.

## 2019-02-02 NOTE — ED Notes (Signed)
Urine sent to lab incase UA is ordered.

## 2019-09-30 ENCOUNTER — Inpatient Hospital Stay: Admit: 2019-09-30 | Payer: Self-pay

## 2021-01-10 IMAGING — US US PELVIS COMPLETE
1 series · 13 of 25 positions shown · non-contrast
Comparison: None

CLINICAL DATA: Right-sided pelvic pain



[Series 1: us pelvis complete · 13 of 117 slices shown]
[im 1/117]
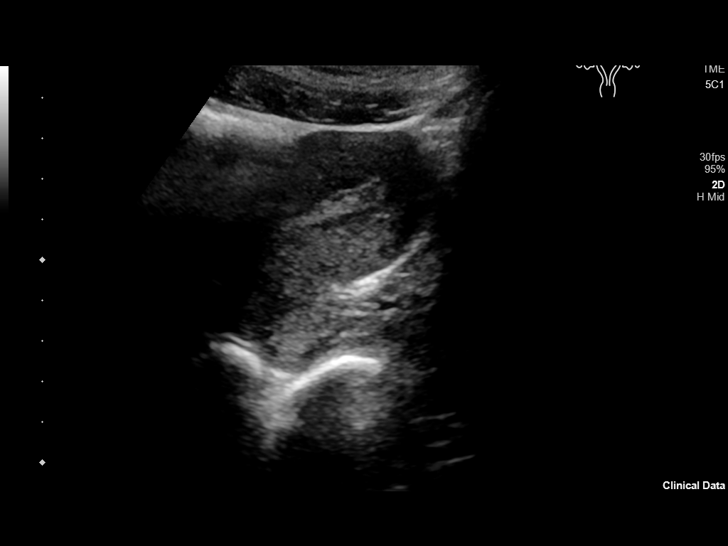
[im 10/117]
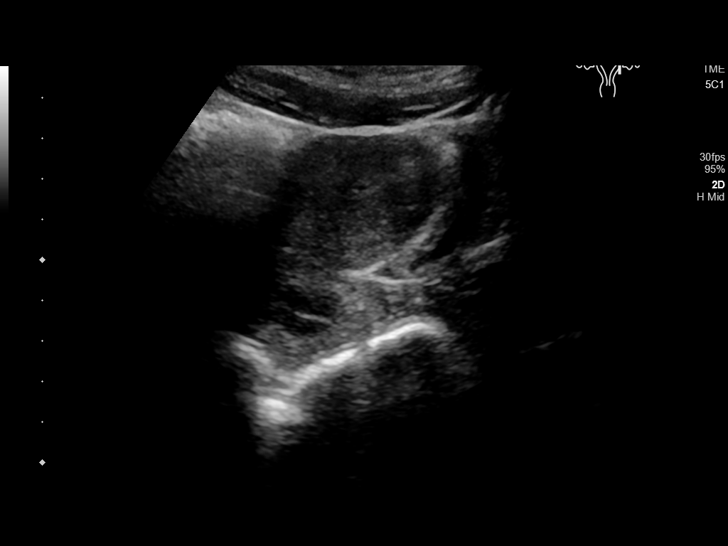
[im 20/117]
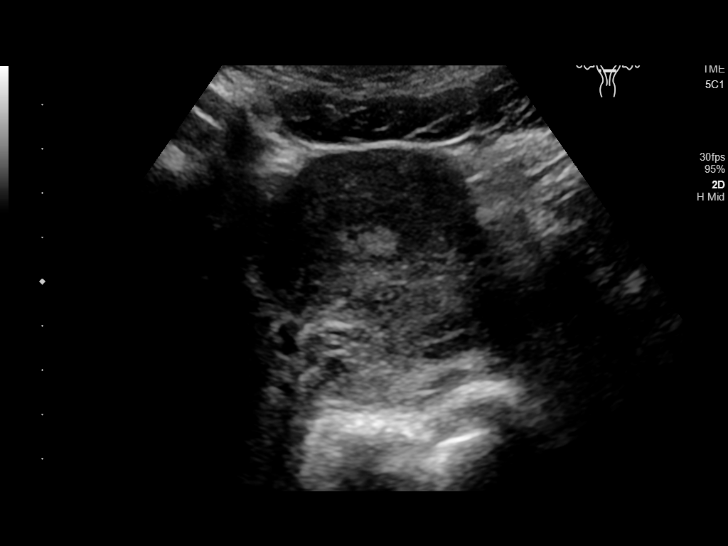
[im 30/117]
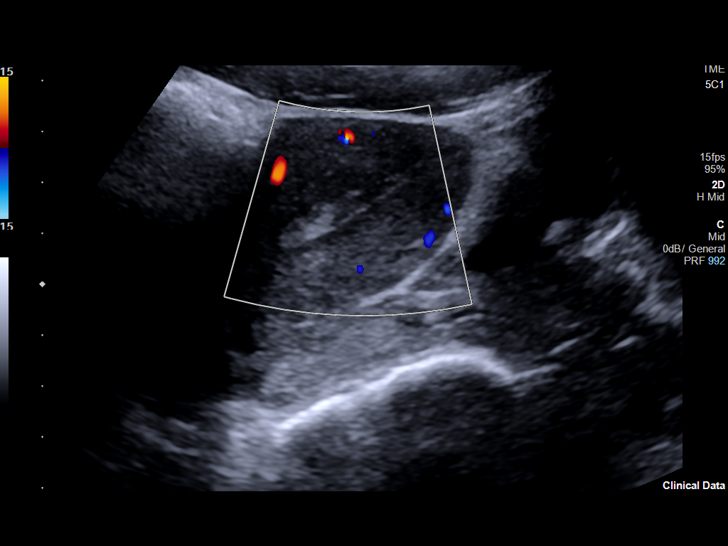
[im 39/117]
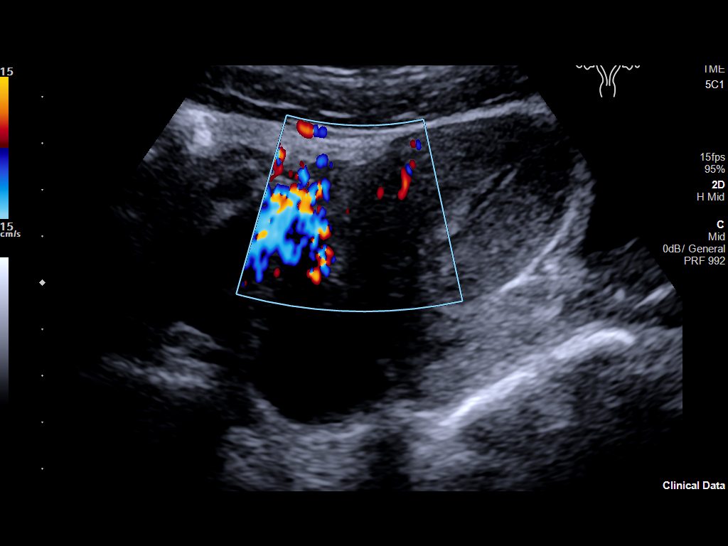
[im 49/117]
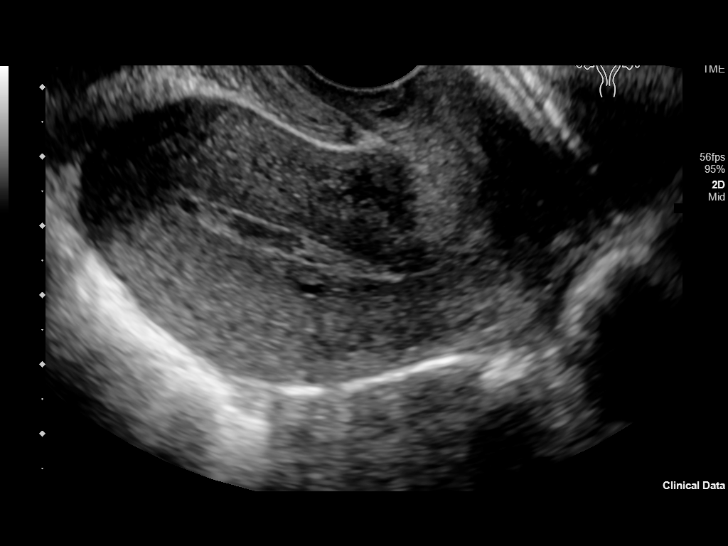
[im 59/117]
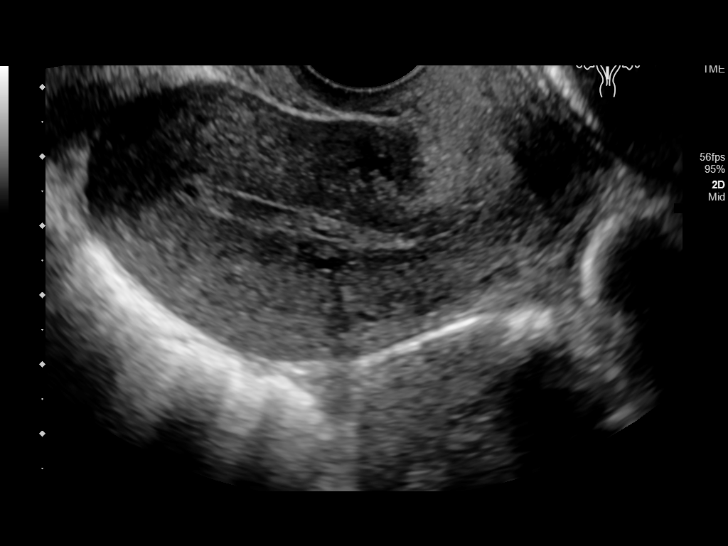
[im 68/117]
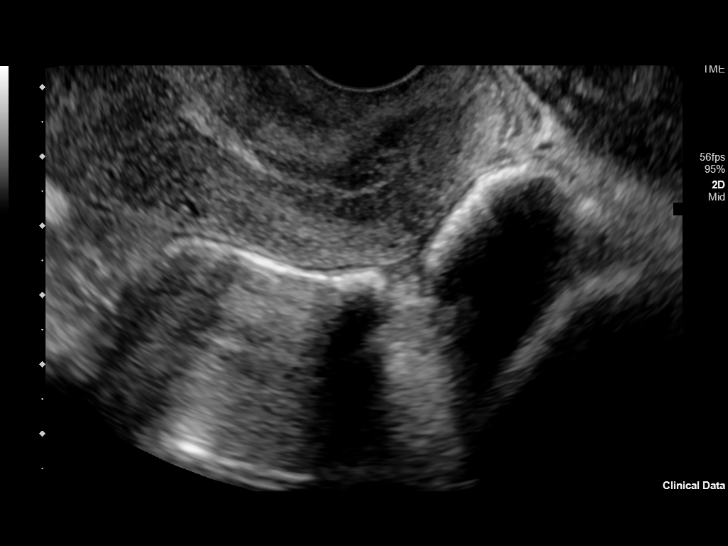
[im 78/117]
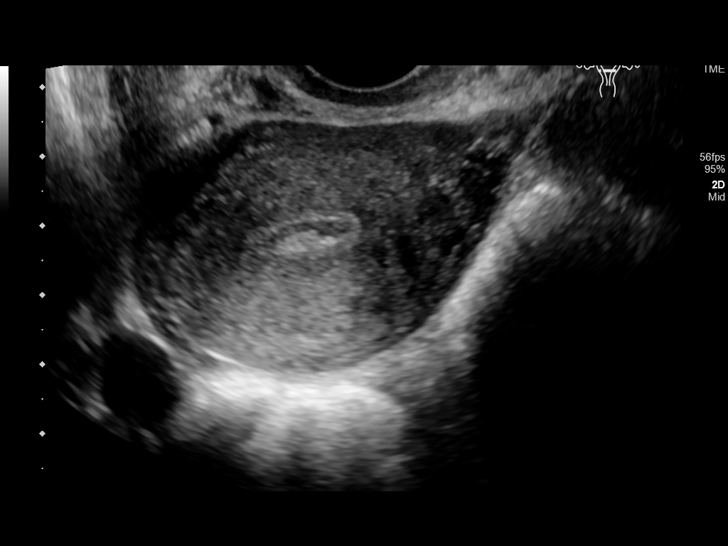
[im 88/117]
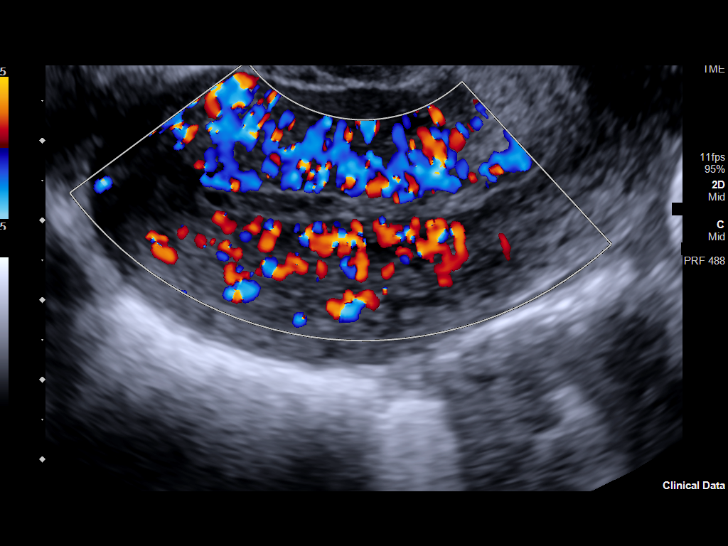
[im 97/117]
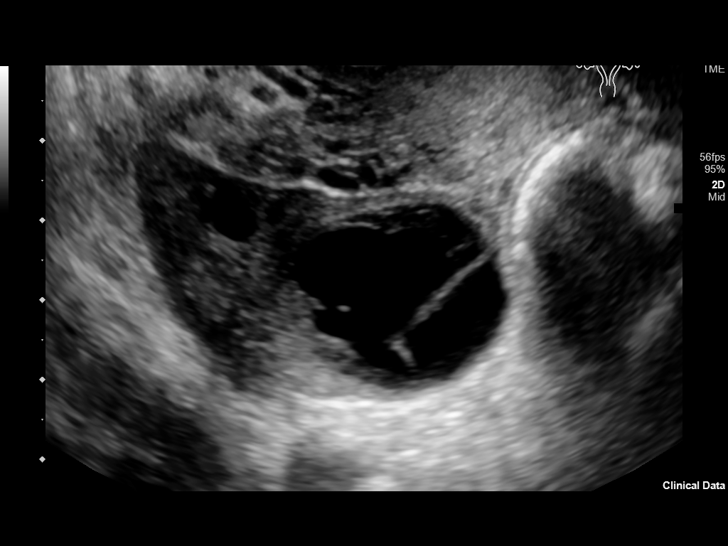
[im 107/117]
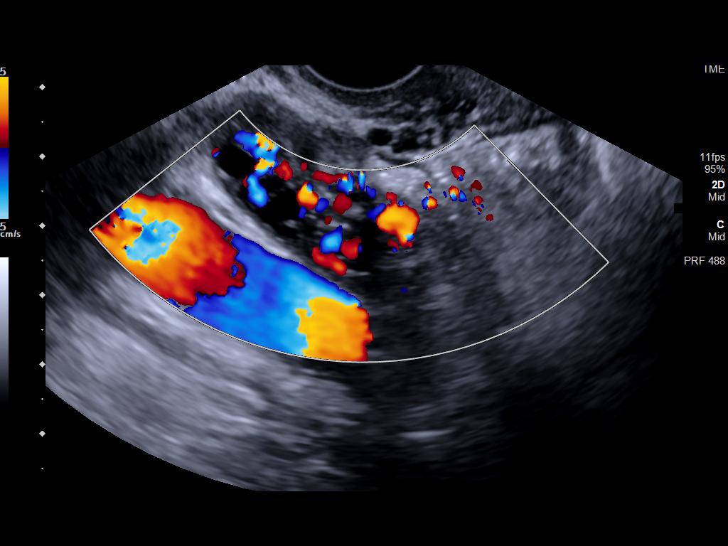
[im 117/117]
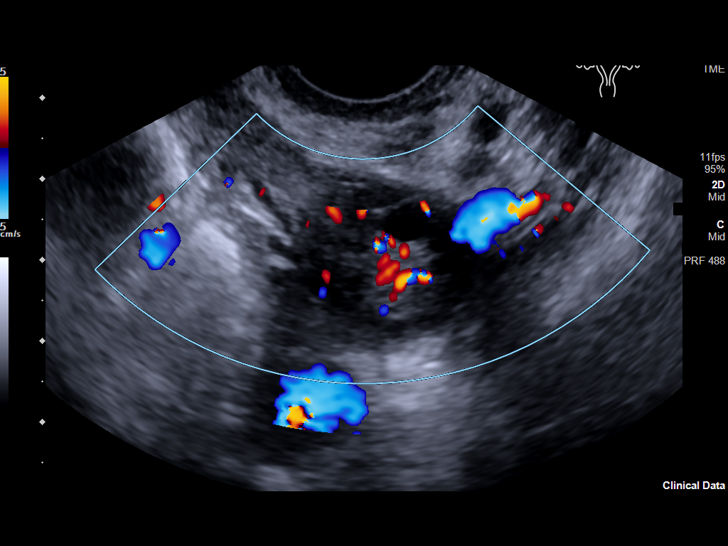

[13 of 25 positions shown; findings below may reference images not displayed]

FINDINGS: Uterus

Measurements: 7.2 x 3.5 x 4.9 cm = volume: 64.2 mL. No fibroids or
other mass visualized. The uterus is retroverted.

Endometrium

Thickness: 3.2 mm.  No focal abnormality visualized.

Right ovary

Measurements: 5.1 x 2.6 x 2.8 cm = volume: 19.7 mL. There is a 2.8 x
2.3 x 2.3 cm well-circumscribed hypoechoic complex cyst associated
with the right ovary without worrisome features but with internal
debris.

Left ovary

Measurements: 3 x 1.5 x 2.3 cm = volume: 5.5 mL. Normal
appearance/no adnexal mass.

Other findings

No abnormal free fluid.
IMPRESSION: 1. Complex right ovarian cyst with low-level internal echoes likely
representing internal debris. No worrisome features are identified.
This measures 2.8 x 2.3 x 2 3 cm.
2. The pelvis is otherwise unremarkable in appearance.

## 2021-01-31 ENCOUNTER — Other Ambulatory Visit: Payer: Self-pay

## 2021-01-31 DIAGNOSIS — Z91040 Latex allergy status: Secondary | ICD-10-CM | POA: Diagnosis not present

## 2021-01-31 DIAGNOSIS — R112 Nausea with vomiting, unspecified: Secondary | ICD-10-CM | POA: Diagnosis present

## 2021-01-31 DIAGNOSIS — R197 Diarrhea, unspecified: Secondary | ICD-10-CM | POA: Diagnosis not present

## 2021-01-31 DIAGNOSIS — R1011 Right upper quadrant pain: Secondary | ICD-10-CM | POA: Diagnosis not present

## 2021-01-31 LAB — COMPREHENSIVE METABOLIC PANEL
ALT: 12 U/L (ref 0–44)
AST: 19 U/L (ref 15–41)
Albumin: 4.3 g/dL (ref 3.5–5.0)
Alkaline Phosphatase: 43 U/L (ref 38–126)
Anion gap: 10 (ref 5–15)
BUN: 13 mg/dL (ref 6–20)
CO2: 19 mmol/L — ABNORMAL LOW (ref 22–32)
Calcium: 9.4 mg/dL (ref 8.9–10.3)
Chloride: 108 mmol/L (ref 98–111)
Creatinine, Ser: 0.66 mg/dL (ref 0.44–1.00)
GFR, Estimated: >60 mL/min (ref >60)
Glucose, Bld: 104 mg/dL — ABNORMAL HIGH (ref 70–99)
Potassium: 4.1 mmol/L (ref 3.5–5.1)
Sodium: 137 mmol/L (ref 135–145)
Total Bilirubin: 0.6 mg/dL (ref 0.3–1.2)
Total Protein: 7.2 g/dL (ref 6.5–8.1)

## 2021-01-31 LAB — CBC
HCT: 41 % (ref 36.0–46.0)
Hemoglobin: 13.2 g/dL (ref 12.0–15.0)
MCH: 26.6 pg (ref 26.0–34.0)
MCHC: 32.2 g/dL (ref 30.0–36.0)
MCV: 82.5 fL (ref 80.0–100.0)
Platelets: 213 10*3/uL (ref 150–400)
RBC: 4.97 MIL/uL (ref 3.87–5.11)
RDW: 16.6 % — ABNORMAL HIGH (ref 11.5–15.5)
WBC: 6.5 10*3/uL (ref 4.0–10.5)
nRBC: 0 % (ref 0.0–0.2)

## 2021-01-31 LAB — LIPASE, BLOOD: Lipase: 41 U/L (ref 11–51)

## 2021-01-31 NOTE — ED Triage Notes (Signed)
Pt complains of fatty greasy stools with frequent bowel movements and abd pain. Pt states has had nausea, no fever.

## 2021-02-01 ENCOUNTER — Emergency Department: Payer: Medicaid Other

## 2021-02-01 ENCOUNTER — Emergency Department
Admission: EM | Admit: 2021-02-01 | Discharge: 2021-02-01 | Disposition: A | Discharge status: Home / Self Care | Payer: Medicaid Other | Attending: Emergency Medicine | Admitting: Emergency Medicine

## 2021-02-01 DIAGNOSIS — R112 Nausea with vomiting, unspecified: Secondary | ICD-10-CM

## 2021-02-01 DIAGNOSIS — R1011 Right upper quadrant pain: Secondary | ICD-10-CM

## 2021-02-01 LAB — URINALYSIS, COMPLETE (UACMP) WITH MICROSCOPIC
Bilirubin Urine: NEGATIVE
Glucose, UA: NEGATIVE mg/dL
Hgb urine dipstick: NEGATIVE
Ketones, ur: NEGATIVE mg/dL
Nitrite: NEGATIVE
Protein, ur: NEGATIVE mg/dL
Specific Gravity, Urine: 1.02 (ref 1.005–1.030)
pH: 6 (ref 5.0–8.0)

## 2021-02-01 LAB — POC URINE PREG, ED: Preg Test, Ur: NEGATIVE

## 2021-02-01 MED ORDER — DICYCLOMINE HCL 20 MG PO TABS: 20.0000 mg | ORAL_TABLET | Freq: Three times a day (TID) | ORAL | 0 refills | Status: AC | PRN

## 2021-02-01 MED ORDER — IOHEXOL 300 MG/ML  SOLN
100.0000 mL | Freq: Once | INTRAMUSCULAR | Status: AC | PRN
  Administered 2021-02-01: 100 mL via INTRAVENOUS

## 2021-02-01 MED ORDER — DICYCLOMINE HCL 10 MG PO CAPS
20.0000 mg | ORAL_CAPSULE | Freq: Once | ORAL | Status: AC
  Administered 2021-02-01: 20 mg via ORAL
  Filled 2021-02-01: qty 2

## 2021-02-01 MED ORDER — ONDANSETRON 4 MG PO TBDP: 4.0000 mg | ORAL_TABLET | Freq: Four times a day (QID) | ORAL | 0 refills | Status: AC | PRN
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
## 2021-02-01 NOTE — ED Notes (Signed)
Sig pad not available, pt verbalizes understanding of d/c instructions, denies questions or concerns. NAD noted, steady gait

## 2021-02-01 NOTE — Discharge Instructions (Addendum)
Your labs, urine were reassuring today.  Ultrasound of your gallbladder was normal.  CT scan of your abdomen also did not show acute abnormality.  Your appendix appeared normal.  I recommend close follow-up with a primary care doctor as well as a gastroenterologist if your symptoms continue.  You may use over-the-counter Imodium as needed for diarrhea.  I recommend a bland, low-fat diet at this time.  Steps to find a Primary Care Provider (PCP):  Call [phone] or [phone] to access "[place] Find a Doctor Service."  2.  You may also go on the [facility] website at [email or web address]

## 2021-02-01 NOTE — ED Notes (Signed)
Pt updated on process. Pt verbalizes understanding.

## 2021-02-14 IMAGING — US US OB COMP LESS 14 WK
1 series · 14 of 28 positions shown · non-contrast
Comparison: None.

CLINICAL DATA: Abdominal and pelvic pain for 5 days. Gestational
age by LMP of 5 weeks 3 days.

EXAM:
OBSTETRIC <14 WK US AND TRANSVAGINAL OB US
TECHNIQUE: Both transabdominal and transvaginal ultrasound examinations were
performed for complete evaluation of the gestation as well as the
maternal uterus, adnexal regions, and pelvic cul-de-sac.
Transvaginal technique was performed to assess early pregnancy.

[Series 1: us ob comp less 14 wk · 14 of 66 slices shown]
[im 3/66]
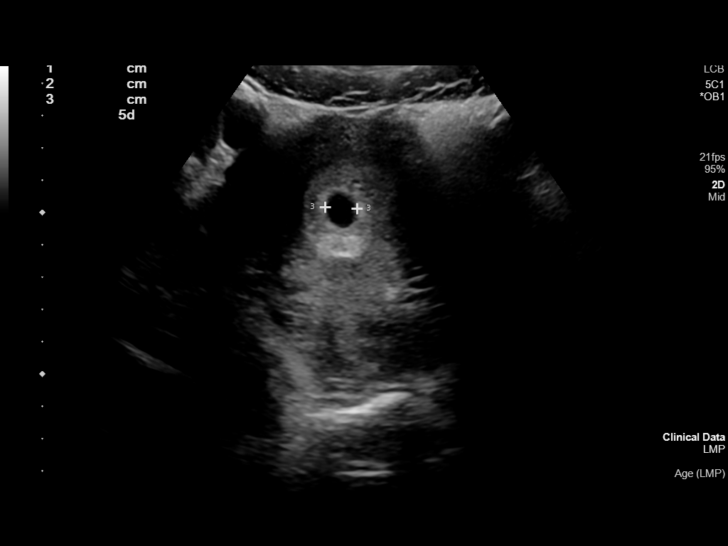
[im 8/66]
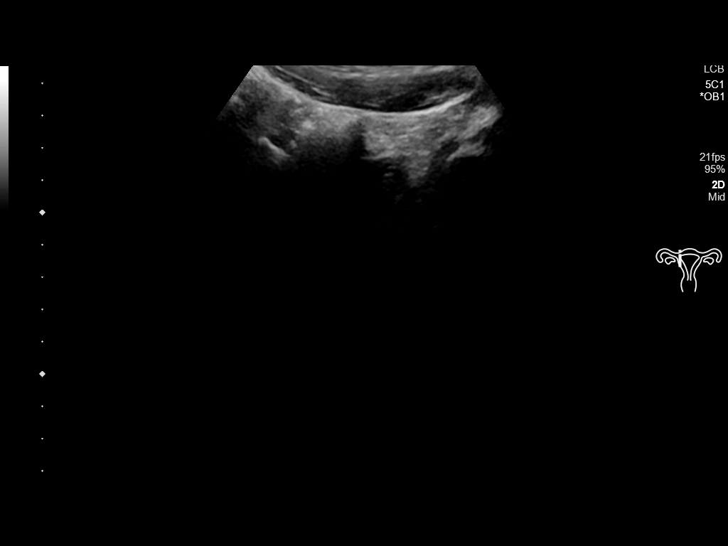
[im 13/66]
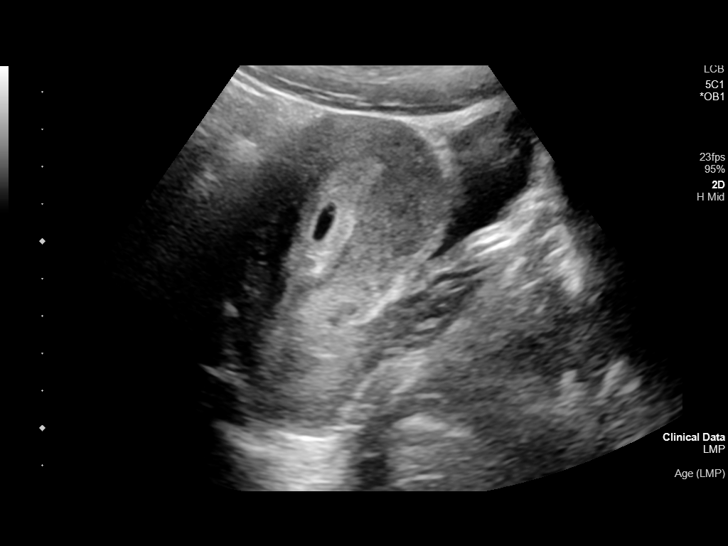
[im 17/66]
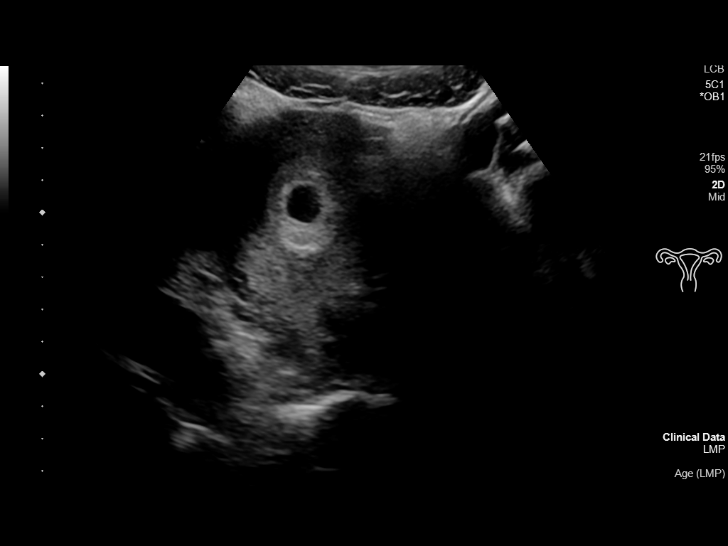
[im 22/66]
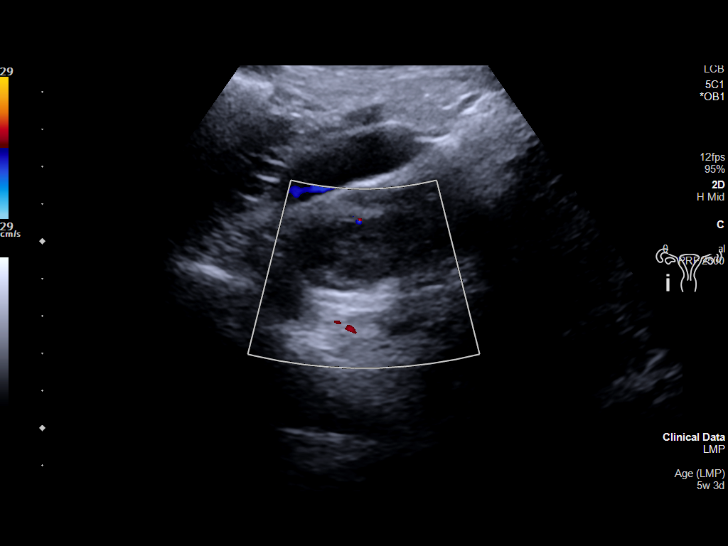
[im 27/66]
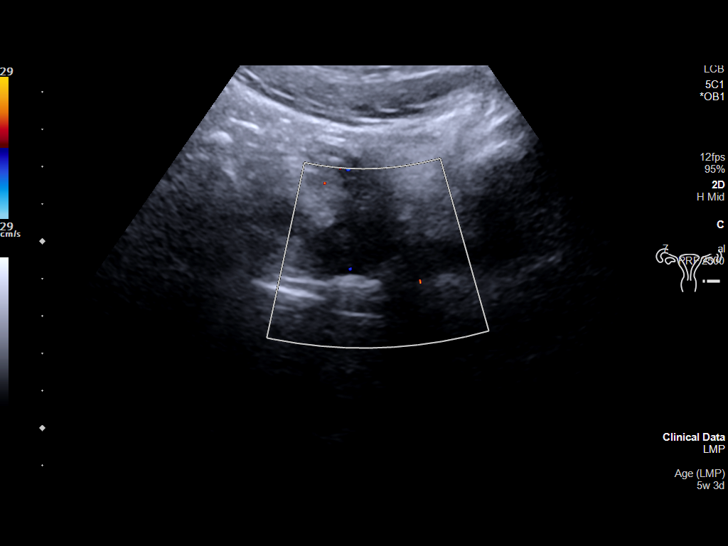
[im 32/66]
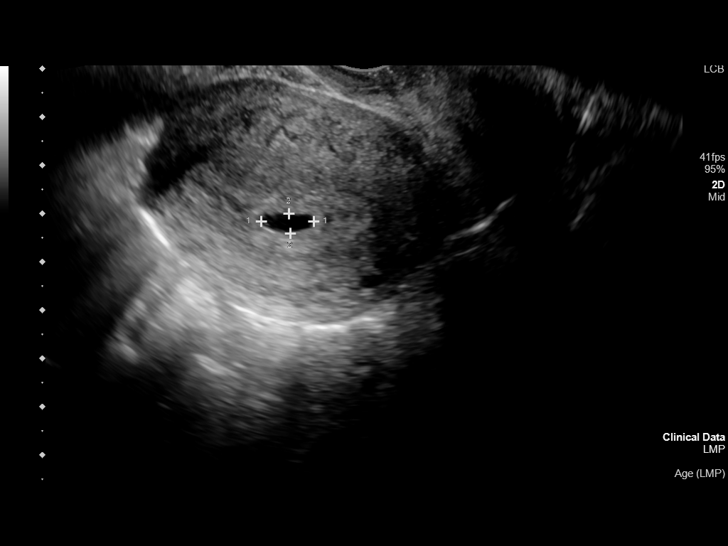
[im 37/66]
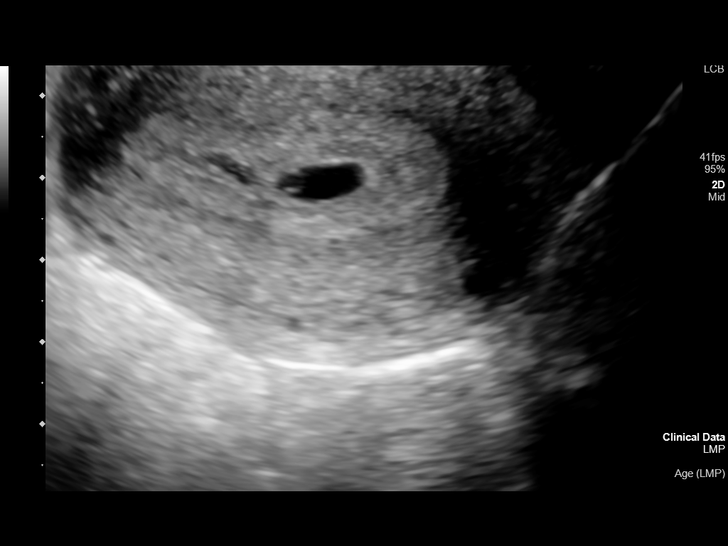
[im 41/66]
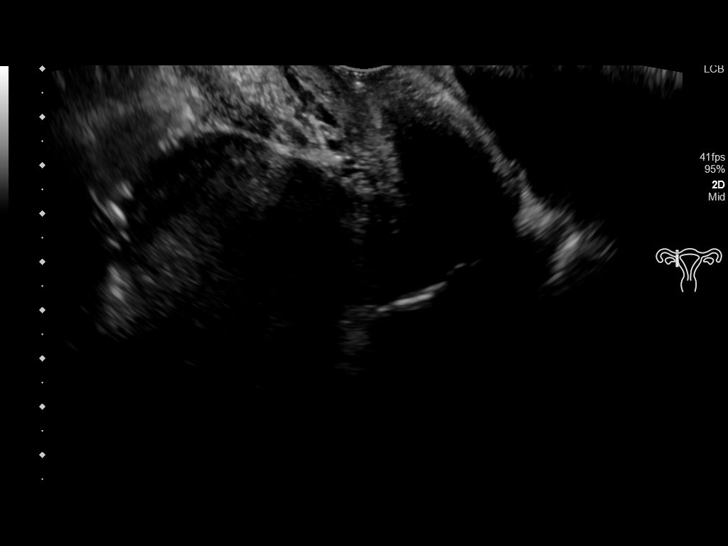
[im 46/66]
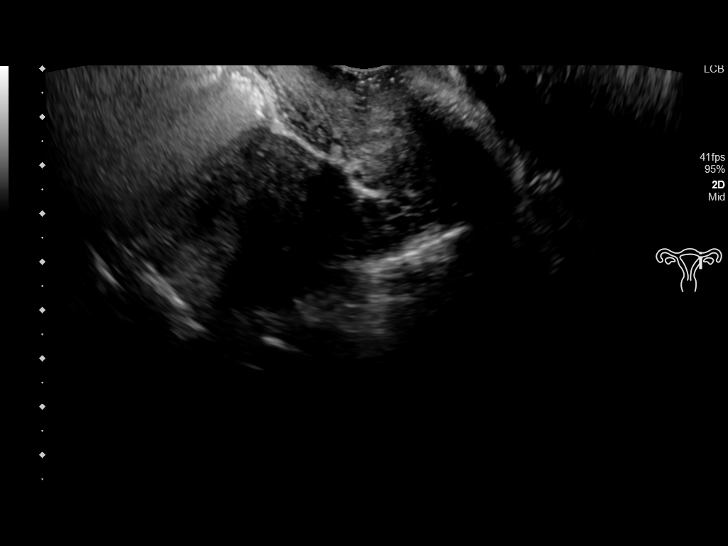
[im 51/66]
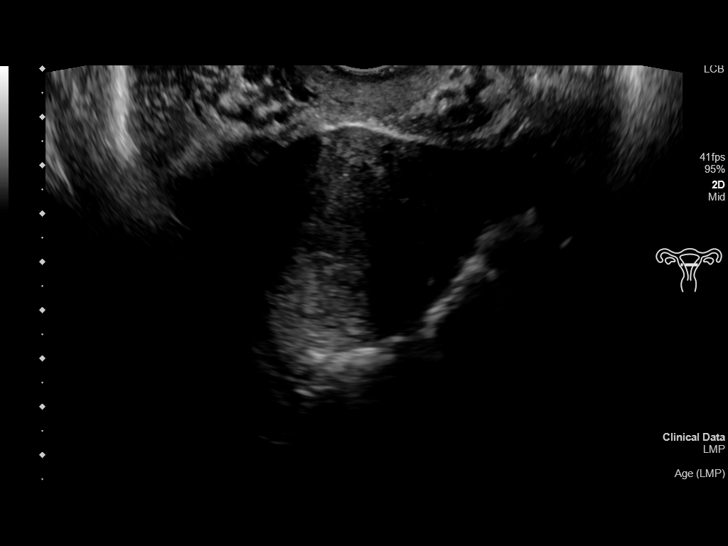
[im 56/66]
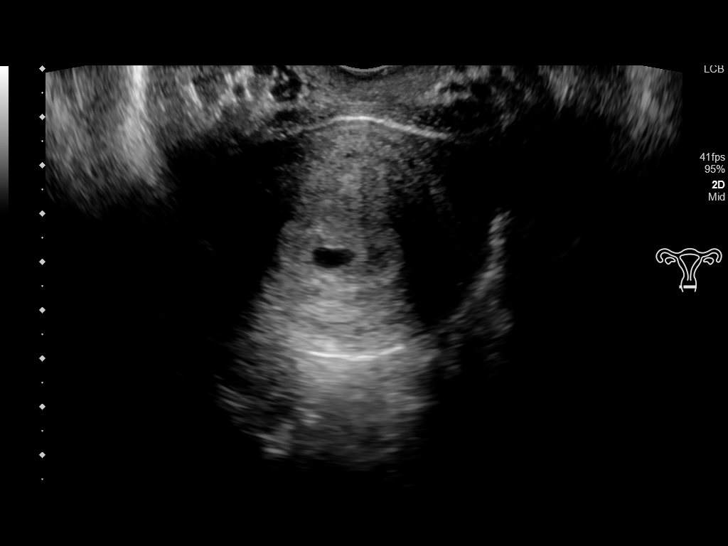
[im 61/66]
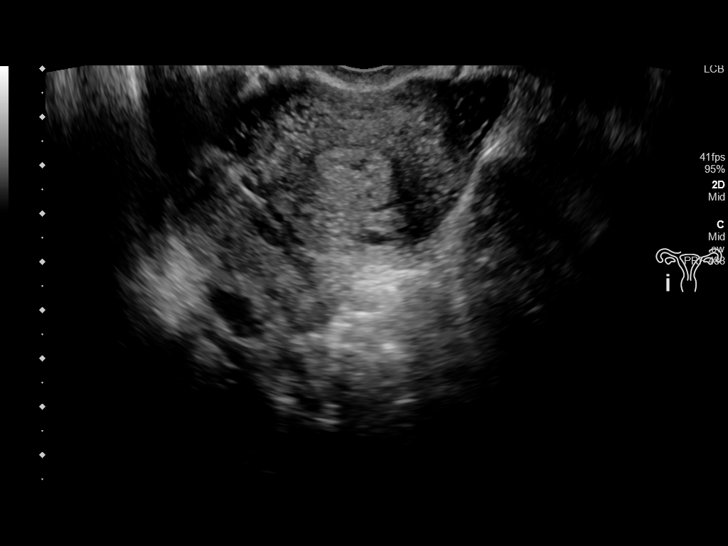
[im 66/66]
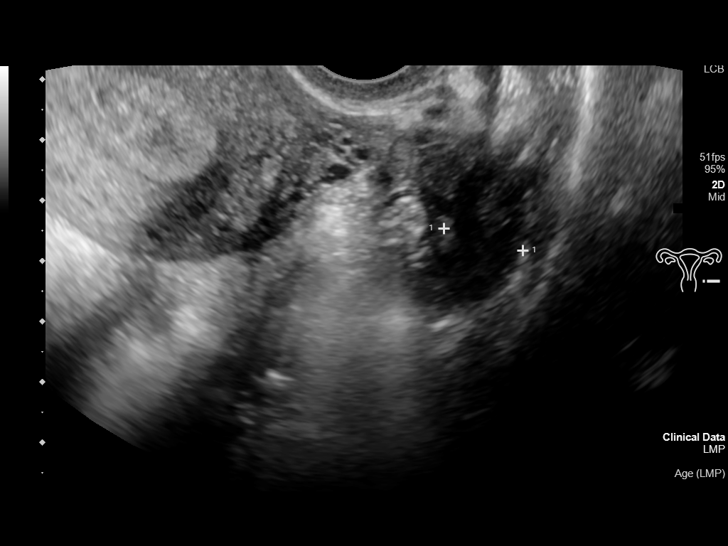

[14 of 28 positions shown; findings below may reference images not displayed]

FINDINGS: Intrauterine gestational sac: Single

Yolk sac:  Visualized.

Embryo:  Not Visualized.

MSD: 8 mm   5 w   4 d

Subchorionic hemorrhage:  None visualized.

Maternal uterus/adnexae: Both ovaries are normal in appearance. No
mass or abnormal free fluid identified.
IMPRESSION: Single intrauterine gestational sac, with estimated gestational age
of 5 weeks 4 days by mean sac diameter. This is concordant with LMP.
Suggest correlation with serial b-hCG levels, and consider followup
ultrasound to assess viability in 10 days.

## 2022-03-14 ENCOUNTER — Ambulatory Visit
Admission: EM | Admit: 2022-03-14 | Discharge: 2022-03-14 | Disposition: A | Discharge status: Home / Self Care | Payer: Medicaid Other

## 2022-03-14 DIAGNOSIS — M542 Cervicalgia: Secondary | ICD-10-CM | POA: Diagnosis not present

## 2022-03-14 MED ORDER — PREDNISONE 20 MG PO TABS: 40.0000 mg | ORAL_TABLET | Freq: Every day | ORAL | 0 refills | Status: AC

## 2022-03-14 MED ORDER — CYCLOBENZAPRINE HCL 10 MG PO TABS: 10.0000 mg | ORAL_TABLET | Freq: Two times a day (BID) | ORAL | 0 refills | Status: AC | PRN

## 2022-03-14 NOTE — ED Provider Notes (Signed)
?[facility] ? ? ? ?CSN: [id number] ?Arrival date & time: 03/14/22  1117 ? ? ?  ? ?History   ?Chief Complaint ?Chief Complaint  ?Patient presents with  ? Neck Pain  ? ? ?HPI ?Patricia Richmond is a 26 y.o. female.  ? ?Patient here today for evaluation of neck pain that is been ongoing intermittently for the last 3 weeks.  She reports that movement, specifically rotation to the left increases pain.  Pain is primarily located to right trapezius area.  She has not taken any medication for symptoms.  She has not had any injury that she is aware of. ? ?The history is provided by the patient.  ?Neck Pain ?Associated symptoms: no fever, no numbness and no weakness   ? ?Past Medical History:  ?Diagnosis Date  ? ADHD   ? Anxiety   ? Bipolar I disorder (HCC)   ? Borderline personality disorder (HCC)   ? Depression   ? History of medication noncompliance   ? Oppositional defiant disorder   ? PTSD (post-traumatic stress disorder)   ? Seizures (HCC)   ? ? ?There are no problems to display for this patient. ? ? ?History reviewed. No pertinent surgical history. ? ?OB History   ?No obstetric history on file. ?  ? ? ? ?Home Medications   ? ?Prior to Admission medications   ?Medication Sig Start Date End Date Taking? Authorizing Provider  ?cyclobenzaprine (FLEXERIL) 10 MG tablet Take 1 tablet (10 mg total) by mouth 2 (two) times daily as needed for muscle spasms. 03/14/22  Yes Tomi Bamberger, PA-C  ?FLUoxetine (PROZAC) 10 MG capsule Take 1 capsule by mouth daily. 03/24/21 04/25/22 Yes [provider]  ?hydrOXYzine (ATARAX) 25 MG tablet Take by mouth. 11/03/20  Yes [provider]  ?LORazepam (ATIVAN) 2 MG/ML injection  11/02/13  Yes [provider]  ?predniSONE (DELTASONE) 20 MG tablet Take 2 tablets (40 mg total) by mouth daily with breakfast for 5 days. 03/14/22 03/19/22 Yes Tomi Bamberger, PA-C  ?rivaroxaban (XARELTO) 10 MG TABS tablet Take 1 tablet by mouth daily. 12/02/21  Yes [provider]  ?traZODone (DESYREL) 50 MG tablet Take 1 tablet by mouth at bedtime. 08/21/20  Yes [provider]  ?dicyclomine (BENTYL) 20 MG tablet Take 1 tablet (20 mg total) by mouth every 8 (eight) hours as needed for spasms (Abdominal cramping). 02/01/21   Ward, Layla Maw, DO  ?levETIRAcetam (KEPPRA) 500 MG tablet Take 1 tablet (500 mg total) by mouth 2 (two) times daily. 12/04/18 02/02/19  Loleta Rose, MD  ?ondansetron (ZOFRAN ODT) 4 MG disintegrating tablet Take 1 tablet (4 mg total) by mouth every 6 (six) hours as needed for nausea or vomiting. 02/01/21   Ward, Layla Maw, DO  ? ? ?Family History ?Family History  ?Problem Relation Age of Onset  ? Hypertension Mother   ? Diabetes Mother   ? Healthy Father   ? ? ?Social History ?Social History  ? ?Tobacco Use  ? Smoking status: Never  ? Smokeless tobacco: Never  ?Substance Use Topics  ? Alcohol use: Not Currently  ? ? ? ?Allergies   ?Chocolate, Latex, Pineapple, and Risperidone ? ? ?Review of Systems ?Review of Systems  ?Constitutional:  Negative for chills and fever.  ?Eyes:  Negative for discharge and redness.  ?Gastrointestinal:  Negative for abdominal pain, nausea and vomiting.  ?Musculoskeletal:  Positive for myalgias and neck pain.  ?Neurological:  Negative for weakness and numbness.  ? ? ?Physical Exam ?  Triage Vital Signs ?ED Triage Vitals [03/14/22 1230]  ?Enc Vitals Group  ?   BP 117/77  ?   Pulse Rate 79  ?   Resp 18  ?   Temp 98.4 ?F (36.9 ?C)  ?   Temp Source Oral  ?   SpO2 98 %  ?   Weight   ?   Height   ?   Head Circumference   ?   Peak Flow   ?   Pain Score 5  ?   Pain Loc   ?   Pain Edu?   ?   Excl. in GC?   ? ?No data found. ? ?Updated Vital Signs ?BP 117/77 (BP Location: Right Arm)   Pulse 79   Temp 98.4 ?F (36.9 ?C) (Oral)   Resp 18   SpO2 98%  ? ?Physical Exam ?Vitals and nursing note reviewed.  ?Constitutional:   ?   General: She is not in acute distress. ?   Appearance: Normal appearance. She is not ill-appearing.  ?HENT:  ?    Head: Normocephalic and atraumatic.  ?Eyes:  ?   Conjunctiva/sclera: Conjunctivae normal.  ?Cardiovascular:  ?   Rate and Rhythm: Normal rate.  ?Pulmonary:  ?   Effort: Pulmonary effort is normal.  ?Musculoskeletal:  ?   Comments: Full ROM of neck with pain noted with extremes and rotation of head to left. No TTP to midline C-Spine, Mild TTP to right trapezius diffusely  ?Neurological:  ?   Mental Status: She is alert.  ?   Comments: Grip strength 5/5 bilaterally  ?Psychiatric:     ?   Mood and Affect: Mood normal.     ?   Behavior: Behavior normal.     ?   Thought Content: Thought content normal.  ? ? ? ?UC Treatments / Results  ?Labs ?(all labs ordered are listed, but only abnormal results are displayed) ?Labs Reviewed - No data to display ? ?EKG ? ? ?Radiology ?No results found. ? ?Procedures ?Procedures (including critical care time) ? ?Medications Ordered in UC ?Medications - No data to display ? ?Initial Impression / Assessment and Plan / UC Course  ?I have reviewed the triage vital signs and the nursing notes. ? ?Pertinent labs & imaging results that were available during my care of the patient were reviewed by me and considered in my medical decision making (see chart for details). ? ?  ?Will treat with steroids and muscle relaxer. Encouraged follow up if no gradual improvement. Follow up with any further concerns.  ? ?Final Clinical Impressions(s) / UC Diagnoses  ? ?Final diagnoses:  ?Neck pain  ? ?Discharge Instructions   ?None ?  ? ?ED Prescriptions   ? ? Medication Sig Dispense Auth. Provider  ? predniSONE (DELTASONE) 20 MG tablet Take 2 tablets (40 mg total) by mouth daily with breakfast for 5 days. 10 tablet Tomi Bamberger, PA-C  ? cyclobenzaprine (FLEXERIL) 10 MG tablet Take 1 tablet (10 mg total) by mouth 2 (two) times daily as needed for muscle spasms. 20 tablet Tomi Bamberger, PA-C  ? ?  ? ?PDMP not reviewed this encounter. ?  ?Tomi Bamberger, PA-C ?03/14/22 1322 ? ?

## 2022-03-14 NOTE — ED Triage Notes (Signed)
3wk h/o intermittent neck pain. Pain with flexion. No meds taken. No falls or injuries. ?

## 2023-02-14 IMAGING — US US ABDOMEN LIMITED RUQ/ASCITES
1 series · 14 of 25 positions shown · non-contrast
Comparison: None.

CLINICAL DATA: Abdominal pain nausea

EXAM:
ULTRASOUND ABDOMEN LIMITED RIGHT UPPER QUADRANT

[Series 1: us abdomen limited ruq (liver/gb) · 14 of 27 slices shown]
[im 1/27]
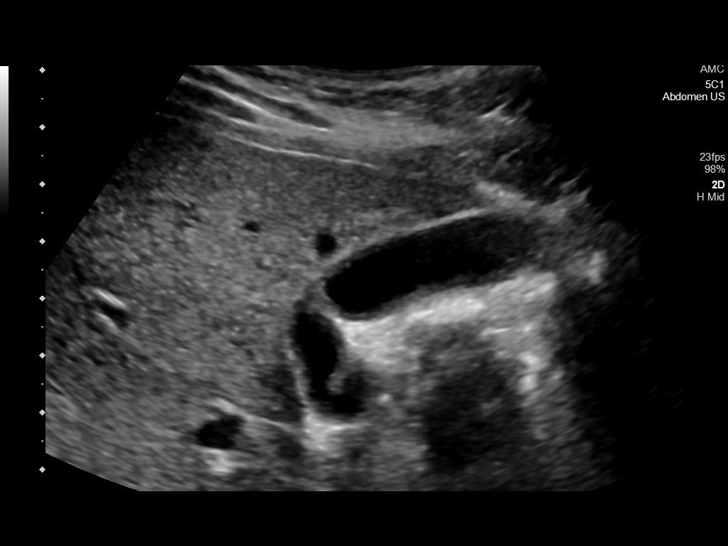
[im 3/27]
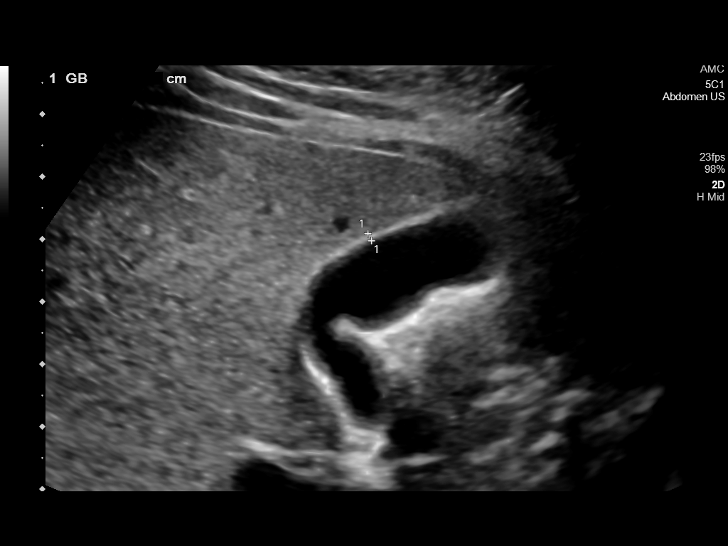
[im 5/27]
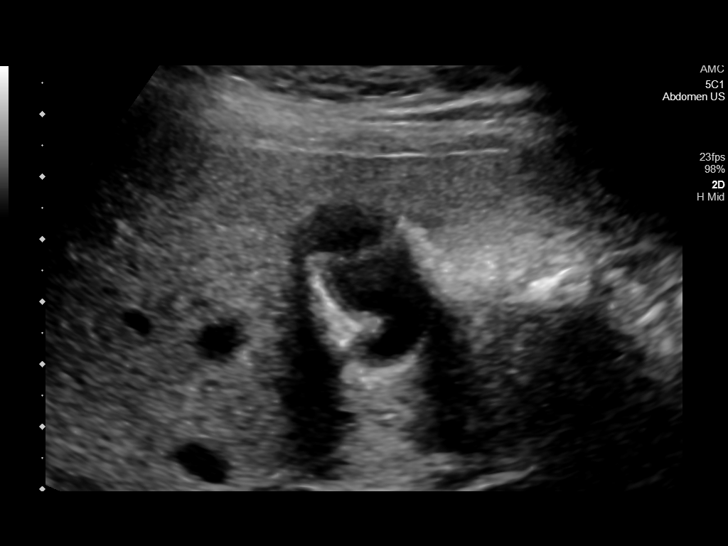
[im 7/27]
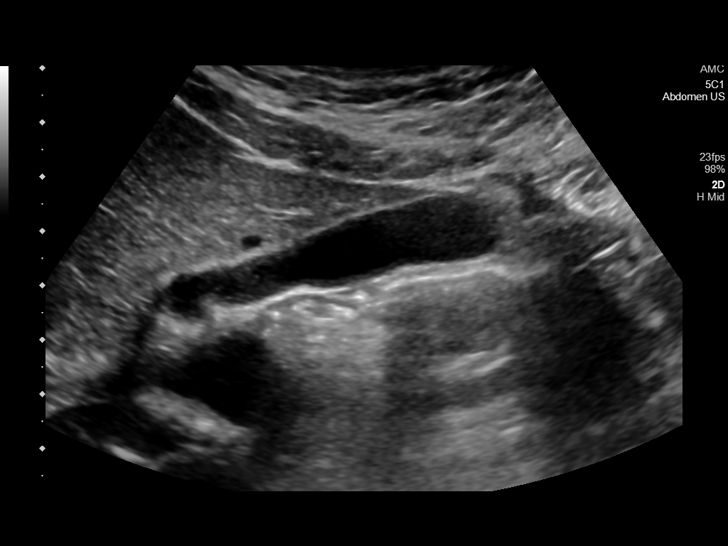
[im 9/27]
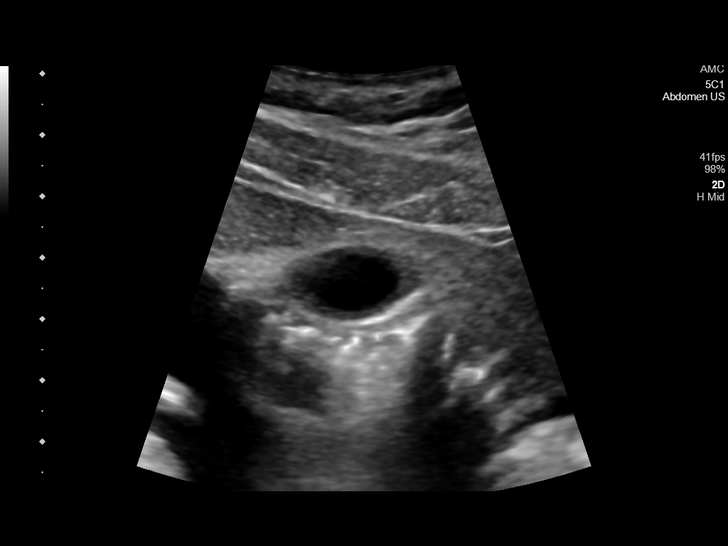
[im 10/27]
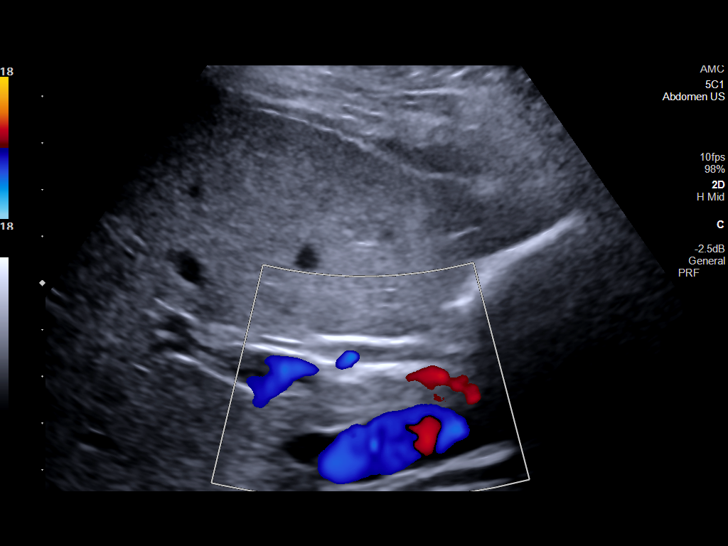
[im 12/27]
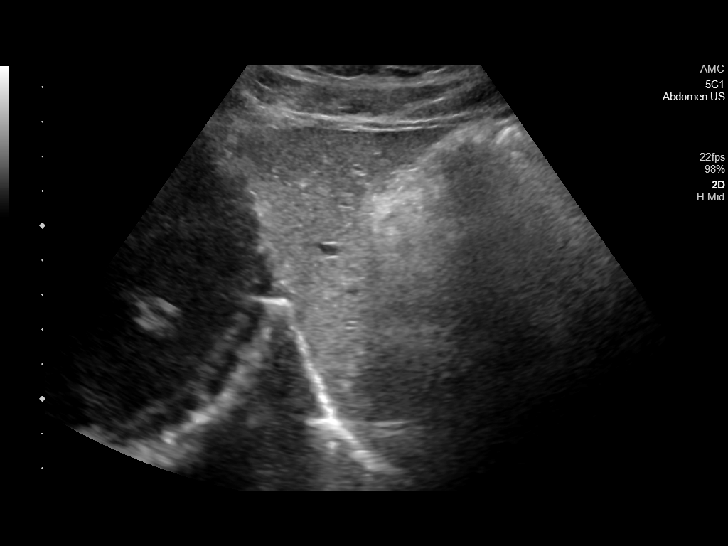
[im 15/27]
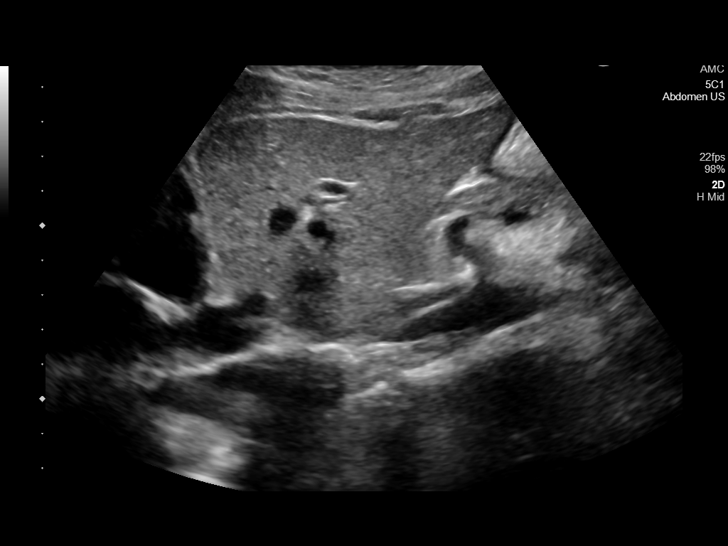
[im 17/27]
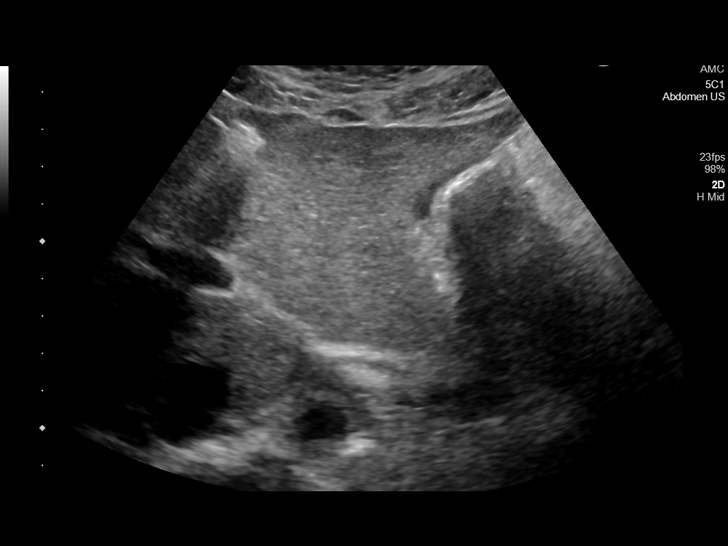
[im 18/27]
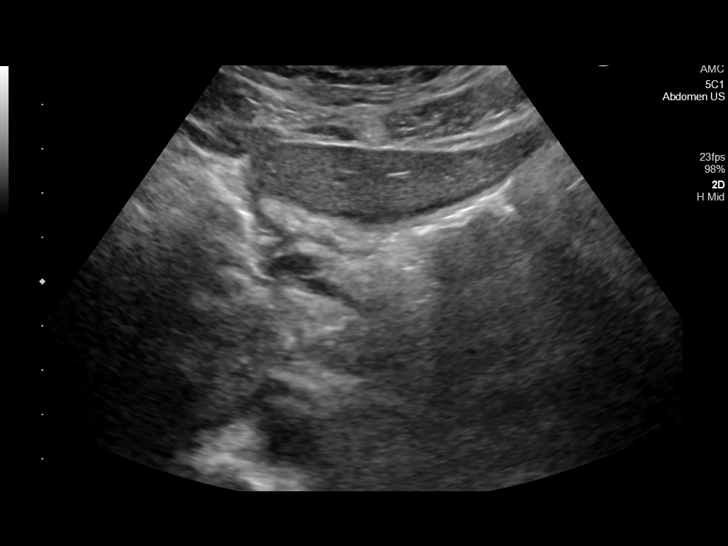
[im 20/27]
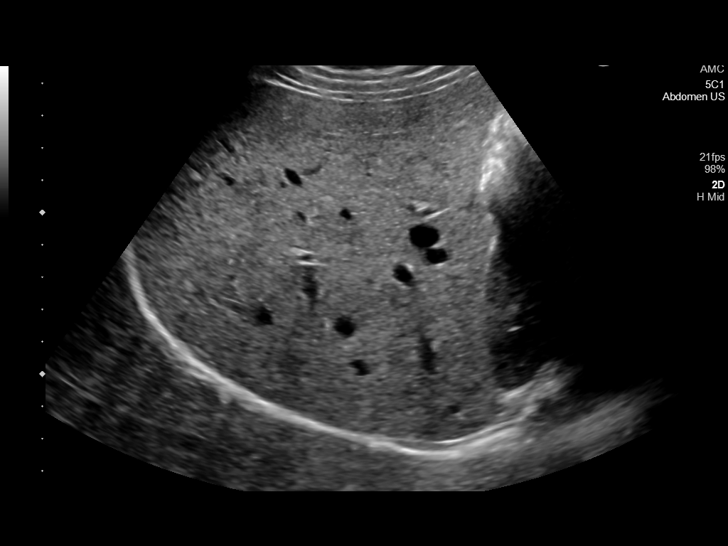
[im 22/27]
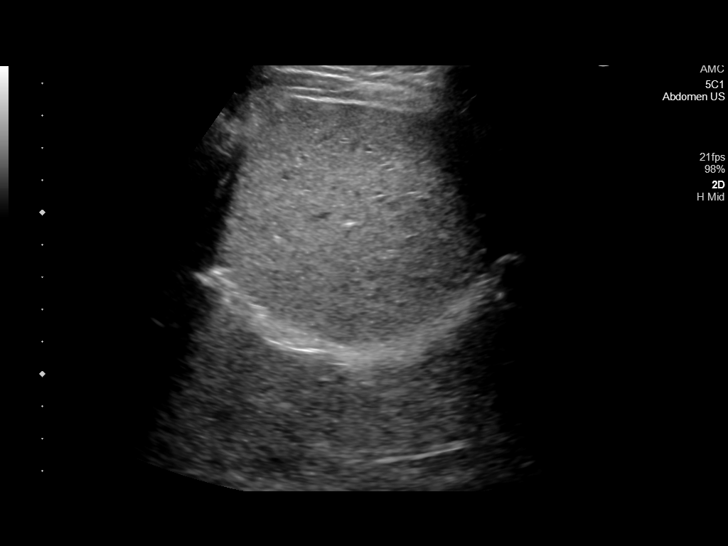
[im 24/27]
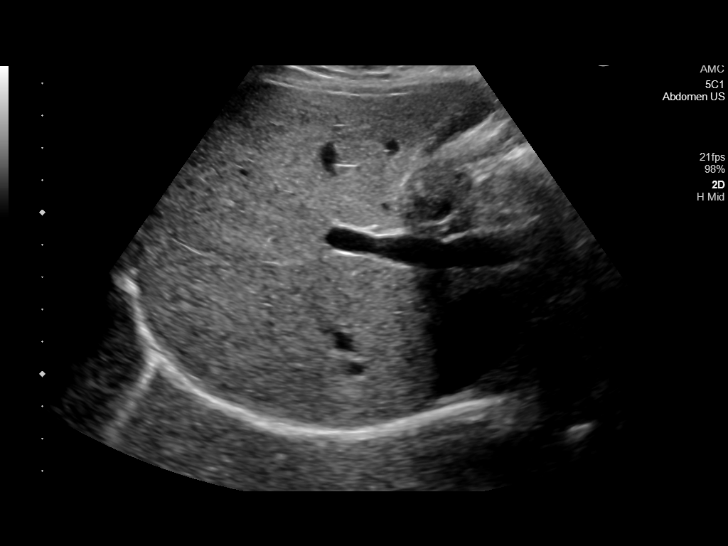
[im 27/27]
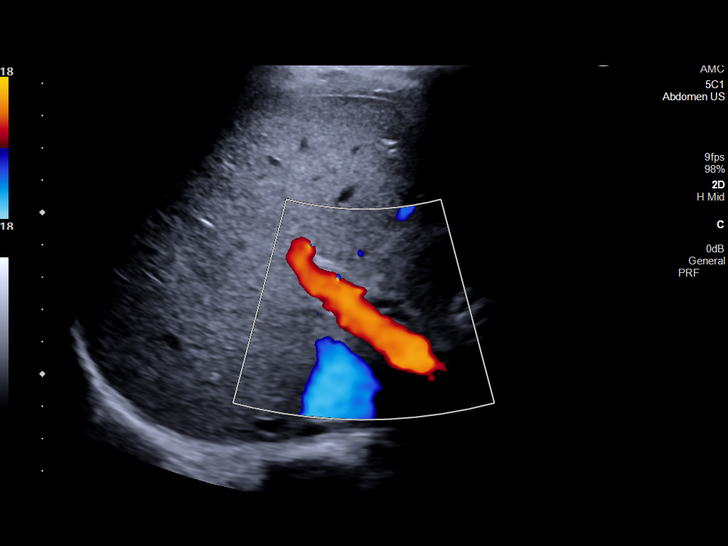

[14 of 25 positions shown; findings below may reference images not displayed]

FINDINGS: Gallbladder:

No gallstones or wall thickening visualized. No sonographic Murphy
sign noted by sonographer.

Common bile duct:

Diameter: 0.6 mm

Liver:

No focal lesion identified. Within normal limits in parenchymal
echogenicity. Portal vein is patent on color Doppler imaging with
normal direction of blood flow towards the liver.

Other: None.
IMPRESSION: Normal examination
